# Patient Record
Sex: Female | Born: 1960 | Race: White | Hispanic: No | Marital: Single | Smoking: Never smoker
Health system: Southern US, Community
[De-identification: ages and names within clinical notes are randomized; demographics above are authoritative.]

## PROBLEM LIST (undated history)

## (undated) DIAGNOSIS — K219 Gastro-esophageal reflux disease without esophagitis: Secondary | ICD-10-CM

## (undated) DIAGNOSIS — M1990 Unspecified osteoarthritis, unspecified site: Secondary | ICD-10-CM

## (undated) DIAGNOSIS — F32A Depression, unspecified: Secondary | ICD-10-CM

## (undated) DIAGNOSIS — G4730 Sleep apnea, unspecified: Secondary | ICD-10-CM

## (undated) DIAGNOSIS — F329 Major depressive disorder, single episode, unspecified: Secondary | ICD-10-CM

## (undated) DIAGNOSIS — T7840XA Allergy, unspecified, initial encounter: Secondary | ICD-10-CM

## (undated) DIAGNOSIS — J302 Other seasonal allergic rhinitis: Secondary | ICD-10-CM

## (undated) DIAGNOSIS — K649 Unspecified hemorrhoids: Secondary | ICD-10-CM

## (undated) HISTORY — DX: Depression, unspecified: F32.A

## (undated) HISTORY — DX: Major depressive disorder, single episode, unspecified: F32.9

## (undated) HISTORY — PX: TONSILLECTOMY: SUR1361

## (undated) HISTORY — DX: Unspecified hemorrhoids: K64.9

## (undated) HISTORY — DX: Gastro-esophageal reflux disease without esophagitis: K21.9

## (undated) HISTORY — DX: Allergy, unspecified, initial encounter: T78.40XA

---

## 2004-10-22 ENCOUNTER — Ambulatory Visit: Payer: Self-pay

## 2005-11-19 ENCOUNTER — Ambulatory Visit: Payer: Self-pay

## 2005-12-09 ENCOUNTER — Ambulatory Visit: Payer: Self-pay

## 2006-05-20 ENCOUNTER — Ambulatory Visit: Payer: Self-pay

## 2006-12-12 ENCOUNTER — Ambulatory Visit: Payer: Self-pay

## 2007-02-13 ENCOUNTER — Ambulatory Visit: Payer: Self-pay | Admitting: Family Medicine

## 2007-02-17 ENCOUNTER — Ambulatory Visit: Payer: Self-pay | Admitting: Family Medicine

## 2007-12-10 IMAGING — US ABDOMEN ULTRASOUND
1 series · 17 of 25 positions shown · non-contrast
Comparison: none

REASON FOR EXAM: abdominal pain
COMMENTS:

[Series 1: abdomen ultrasound · 17 of 63 slices shown]
[im 1/63]
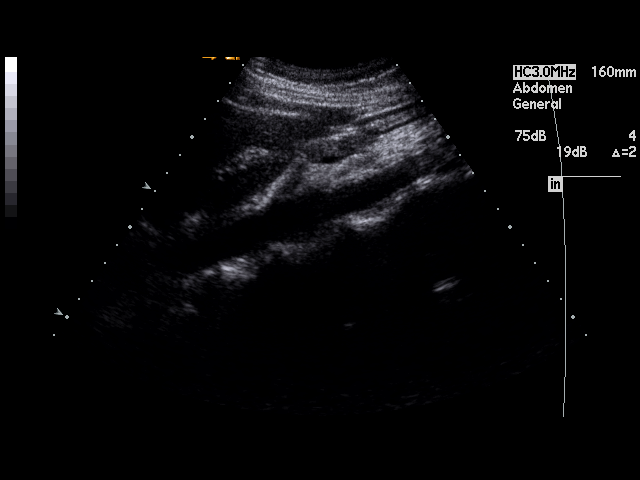
[im 6/63]
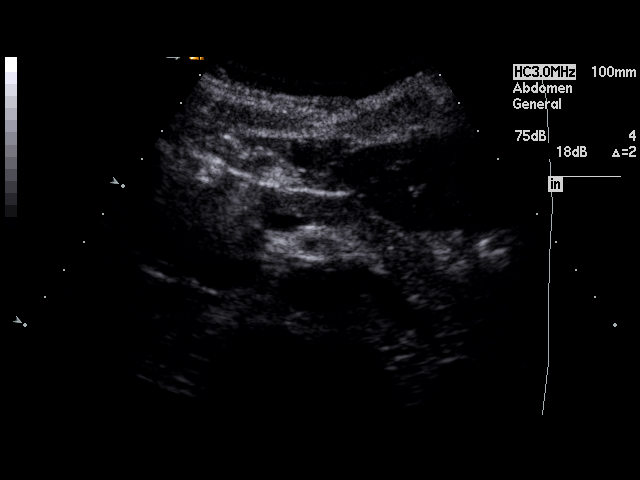
[im 8/63]
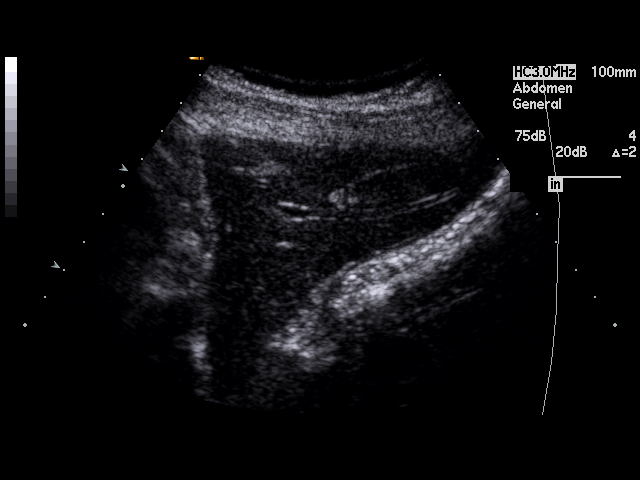
[im 13/63]
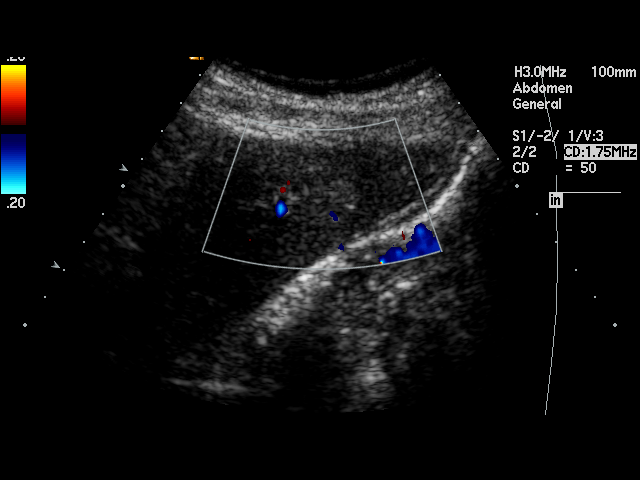
[im 16/63]
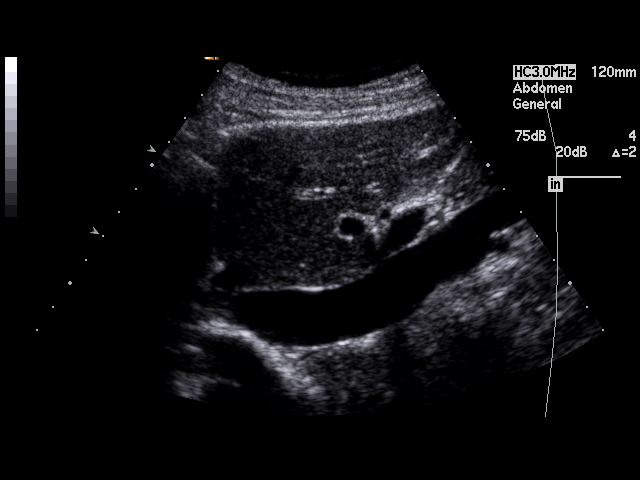
[im 21/63]
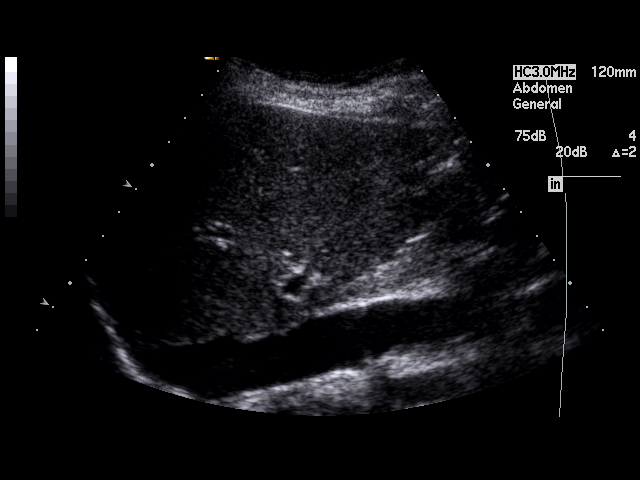
[im 24/63]
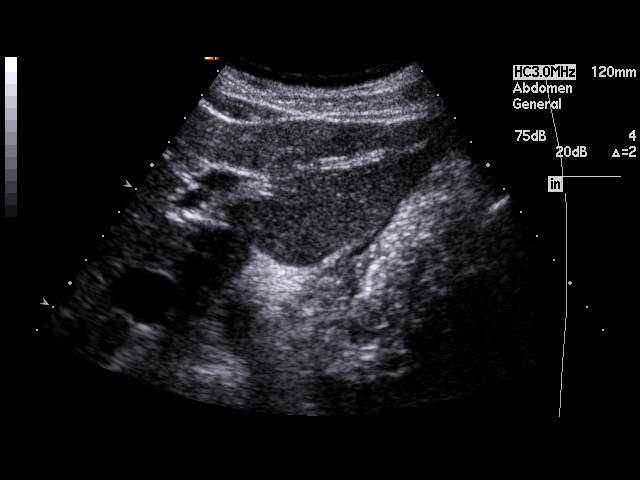
[im 29/63]
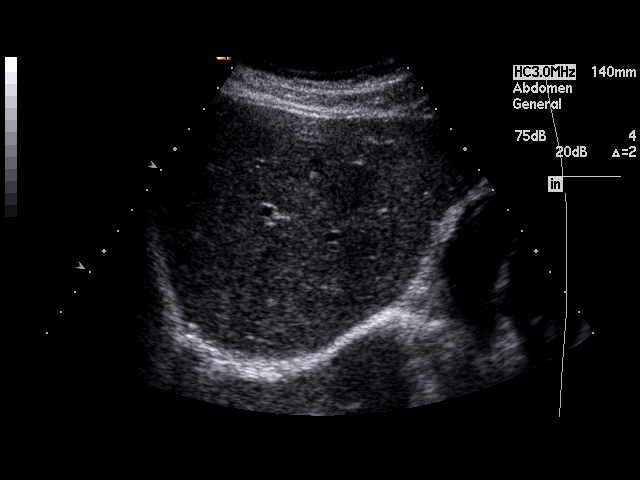
[im 32/63]
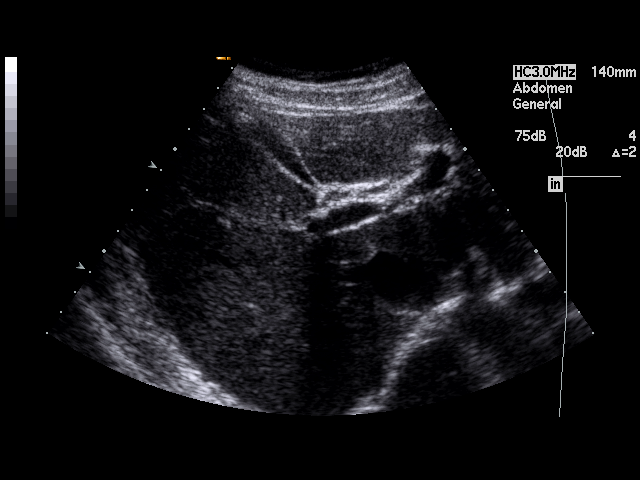
[im 34/63]
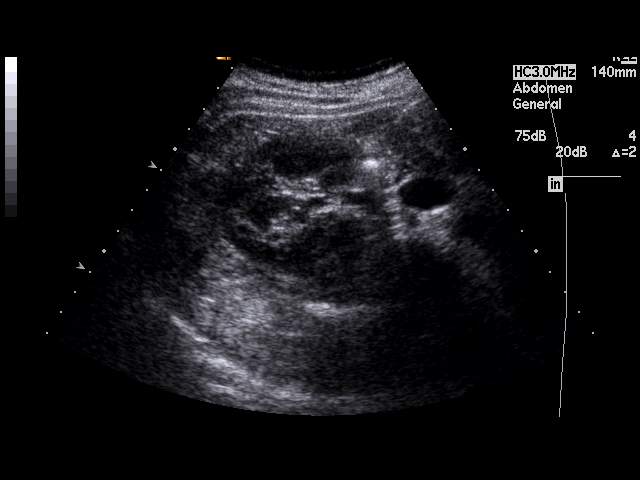
[im 39/63]
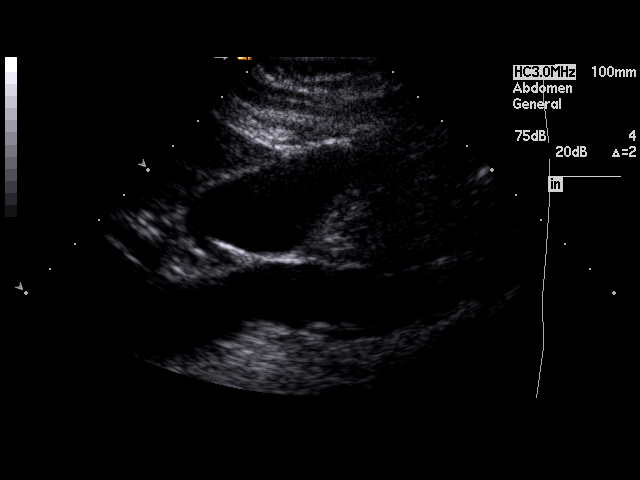
[im 42/63]
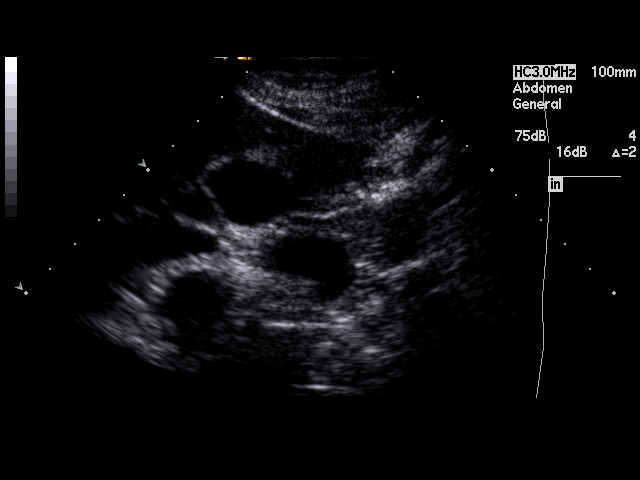
[im 47/63]
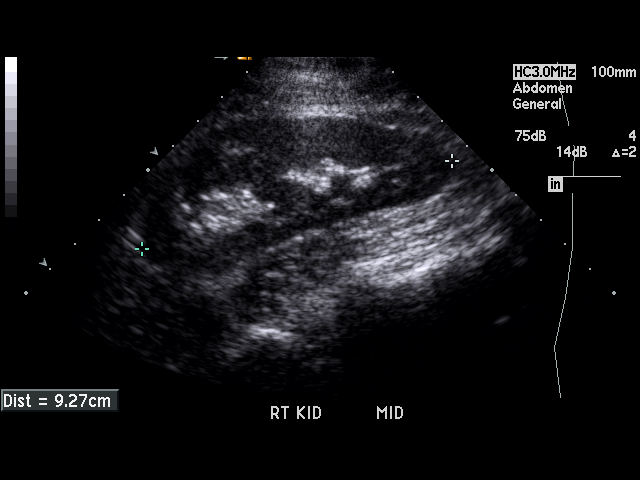
[im 50/63]
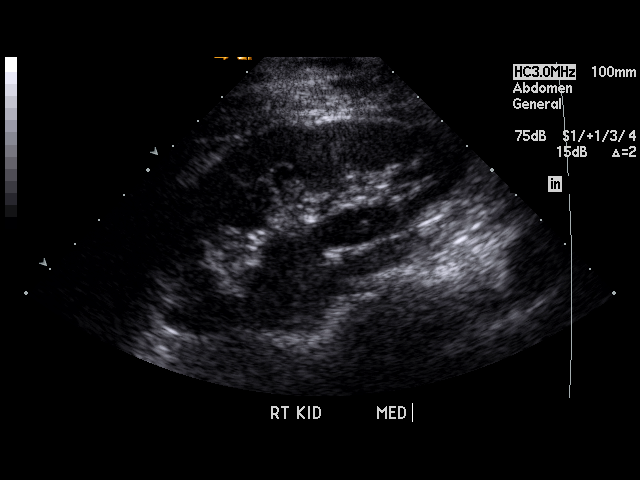
[im 55/63]
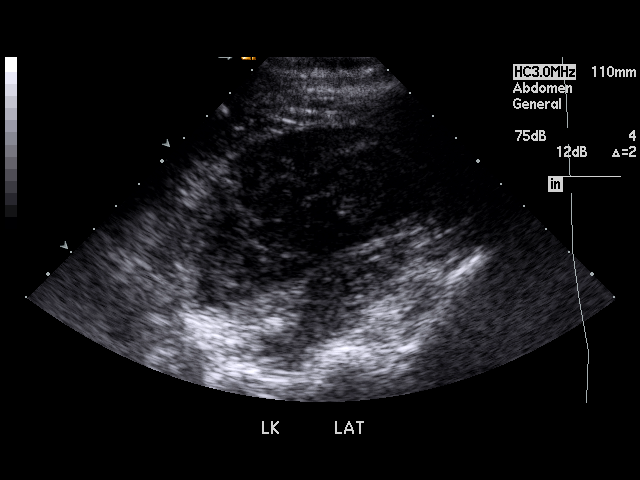
[im 57/63]
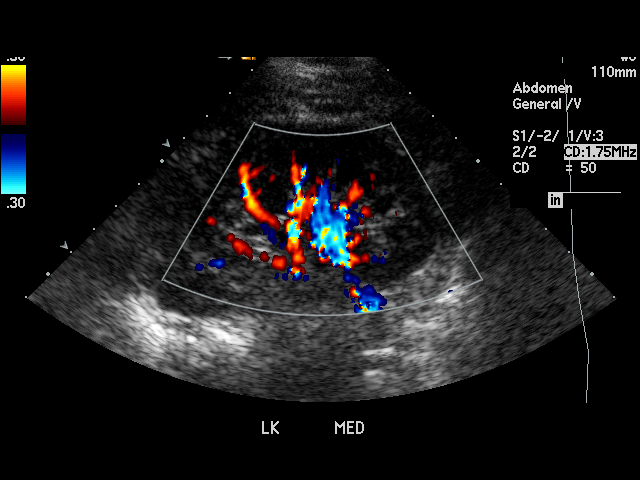
[im 63/63]
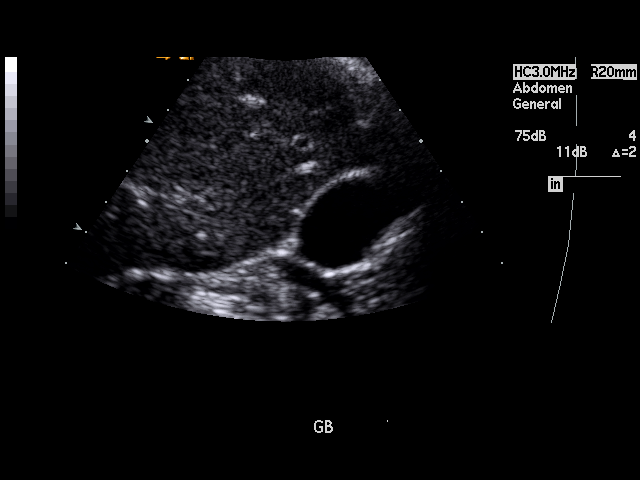

[17 of 25 positions shown; findings below may reference images not displayed]

PROCEDURE:     US  - US ABDOMEN GENERAL SURVEY  - November 19, 2005  [DATE]

RESULT:          There is a 1 cm, slightly hyperechoic focus in the LEFT
lobe of the liver.  This is actually thought to be artifactual and secondary
to adjacent vascular structures.  The possibility, however, of a small
hepatic hemangioma cannot be totally excluded on this exam.  Follow up
examination in six months is recommended to document stability.  The hepatic
echo pattern otherwise is normal in appearance.  The spleen, abdominal
aorta, and pancreas show no significant abnormalities.  No gallstones are
seen. There is no thickening of the gallbladder wall.  The common bile duct
measures 3.2 mm in diameter, which is within normal limits.  The kidneys
show no hydronephrosis.  There is no ascites.
IMPRESSION: 1.     Possible 1 cm hyperechoic focus in the liver as described above.
Follow up examination in six months is recommended to document stability.
2.     No gallstones or other acute change is identified.

## 2007-12-13 ENCOUNTER — Ambulatory Visit: Payer: Self-pay

## 2009-02-03 ENCOUNTER — Ambulatory Visit: Payer: Self-pay

## 2010-02-05 ENCOUNTER — Ambulatory Visit: Payer: Self-pay

## 2010-08-06 ENCOUNTER — Ambulatory Visit: Payer: Self-pay | Admitting: Family Medicine

## 2011-04-13 ENCOUNTER — Ambulatory Visit: Payer: Self-pay

## 2012-04-18 ENCOUNTER — Ambulatory Visit: Payer: Self-pay

## 2013-06-07 HISTORY — PX: COLONOSCOPY: SHX174

## 2013-12-11 ENCOUNTER — Ambulatory Visit: Payer: Self-pay | Admitting: Obstetrics and Gynecology

## 2014-01-04 ENCOUNTER — Ambulatory Visit: Payer: Self-pay | Admitting: Gastroenterology

## 2015-06-25 ENCOUNTER — Encounter: Payer: Self-pay | Admitting: Family Medicine

## 2015-10-23 ENCOUNTER — Ambulatory Visit (INDEPENDENT_AMBULATORY_CARE_PROVIDER_SITE_OTHER): Payer: BLUE CROSS/BLUE SHIELD | Admitting: Family Medicine

## 2015-10-23 ENCOUNTER — Encounter: Payer: Self-pay | Admitting: Family Medicine

## 2015-10-23 VITALS — BP 100/70 | HR 80 | Ht 65.0 in | Wt 146.0 lb

## 2015-10-23 DIAGNOSIS — K219 Gastro-esophageal reflux disease without esophagitis: Secondary | ICD-10-CM

## 2015-10-23 DIAGNOSIS — F329 Major depressive disorder, single episode, unspecified: Secondary | ICD-10-CM

## 2015-10-23 DIAGNOSIS — B354 Tinea corporis: Secondary | ICD-10-CM | POA: Diagnosis not present

## 2015-10-23 DIAGNOSIS — L259 Unspecified contact dermatitis, unspecified cause: Secondary | ICD-10-CM | POA: Diagnosis not present

## 2015-10-23 DIAGNOSIS — F32A Depression, unspecified: Secondary | ICD-10-CM

## 2015-10-23 DIAGNOSIS — Z13220 Encounter for screening for lipoid disorders: Secondary | ICD-10-CM

## 2015-10-23 MED ORDER — CITALOPRAM HYDROBROMIDE 40 MG PO TABS: 40.0000 mg | ORAL_TABLET | Freq: Every day | ORAL | Status: DC

## 2015-10-23 MED ORDER — NYSTATIN-TRIAMCINOLONE 100000-0.1 UNIT/GM-% EX CREA: 1.0000 "application " | TOPICAL_CREAM | Freq: Two times a day (BID) | CUTANEOUS | Status: DC

## 2015-10-23 MED ORDER — CETIRIZINE-PSEUDOEPHEDRINE ER 5-120 MG PO TB12: 1.0000 | ORAL_TABLET | Freq: Two times a day (BID) | ORAL | Status: DC

## 2015-10-23 MED ORDER — PANTOPRAZOLE SODIUM 40 MG PO TBEC: 40.0000 mg | DELAYED_RELEASE_TABLET | Freq: Every day | ORAL | Status: DC

## 2015-10-23 NOTE — Addendum Note (Signed)
Addended by: Everitt AmberLYNCH, TARA L on: 10/23/2015 03:34 PM   Modules accepted: Orders

## 2015-10-23 NOTE — Progress Notes (Signed)
Name: Debbie Bell   MRN: [id number]    DOB: 12/11/1960   Date:10/23/2015       Progress Note  Subjective  Chief Complaint  Chief Complaint  Patient presents with  . Depression  . Gastroesophageal Reflux  . Allergic Rhinitis   . Rash    itching on body in areas of "warmth"    Depression        This is a chronic problem.  The current episode started more than 1 year ago.   The onset quality is gradual.   The problem occurs intermittently.  The problem has been gradually improving since onset.  Associated symptoms include no decreased concentration, no fatigue, no helplessness, no hopelessness, does not have insomnia, not irritable, no restlessness, no decreased interest, no appetite change, no body aches, no myalgias, no headaches, no indigestion, not sad and no suicidal ideas.     The symptoms are aggravated by nothing.  Past treatments include SSRIs - Selective serotonin reuptake inhibitors.  Compliance with treatment is good.  Previous treatment provided no relief relief. Gastroesophageal Reflux She complains of heartburn. She reports no abdominal pain, no belching, no chest pain, no choking, no coughing, no dysphagia, no nausea, no sore throat or no wheezing. This is a chronic problem. The current episode started more than 1 year ago. The symptoms are aggravated by certain foods. Pertinent negatives include no fatigue, melena or weight loss. She has tried a PPI for the symptoms. The treatment provided significant (no relief on nexium) relief.  Rash This is a chronic problem. The current episode started more than 1 year ago. The problem has been waxing and waning since onset. The rash is characterized by itchiness. She was exposed to plant contact. Pertinent negatives include no congestion, cough, diarrhea, fatigue, fever, joint pain, shortness of breath or sore throat. The treatment provided moderate relief.    No problem-specific assessment & plan notes found for this encounter.   Past  Medical History  Diagnosis Date  . Allergy   . Hemorrhoid   . GERD (gastroesophageal reflux disease)   . Depression     Past Surgical History  Procedure Laterality Date  . Tonsillectomy    . Colonoscopy  2015    polyps/ repeat in 2 yrs- Dr Servando Snare    Family History  Problem Relation Age of Onset  . Heart disease Father   . Cancer Paternal Aunt   . Stroke Maternal Grandmother     Social History   Social History  . Marital Status: Single    Spouse Name: N/A  . Number of Children: N/A  . Years of Education: N/A   Occupational History  . Not on file.   Social History Main Topics  . Smoking status: Never Smoker   . Smokeless tobacco: Not on file  . Alcohol Use: 0.0 oz/week    0 Standard drinks or equivalent per week  . Drug Use: No  . Sexual Activity: Yes   Other Topics Concern  . Not on file   Social History Narrative  . No narrative on file    Allergies  Allergen Reactions  . Penicillins      Review of Systems  Constitutional: Negative for fever, chills, weight loss, malaise/fatigue, appetite change and fatigue.  HENT: Negative for congestion, ear discharge, ear pain and sore throat.   Eyes: Negative for blurred vision.  Respiratory: Negative for cough, sputum production, choking, shortness of breath and wheezing.   Cardiovascular: Negative for chest pain, palpitations and  leg swelling.  Gastrointestinal: Positive for heartburn. Negative for dysphagia, nausea, abdominal pain, diarrhea, constipation, blood in stool and melena.  Genitourinary: Negative for dysuria, urgency, frequency and hematuria.  Musculoskeletal: Negative for myalgias, back pain, joint pain and neck pain.  Skin: Positive for rash.  Neurological: Negative for dizziness, tingling, sensory change, focal weakness and headaches.  Endo/Heme/Allergies: Negative for environmental allergies and polydipsia. Does not bruise/bleed easily.  Psychiatric/Behavioral: Positive for depression. Negative for  suicidal ideas and decreased concentration. The patient is not nervous/anxious and does not have insomnia.      Objective  Filed Vitals:   10/23/15 0837  BP: 100/70  Pulse: 80  Height: 5\' 5"  (1.651 m)  Weight: 146 lb (66.225 kg)    Physical Exam  Constitutional: She is well-developed, well-nourished, and in no distress. She is not irritable. No distress.  HENT:  Head: Normocephalic and atraumatic.  Right Ear: External ear normal.  Left Ear: External ear normal.  Nose: Nose normal.  Mouth/Throat: Oropharynx is clear and moist.  Eyes: Conjunctivae and EOM are normal. Pupils are equal, round, and reactive to light. Right eye exhibits no discharge. Left eye exhibits no discharge.  Neck: Normal range of motion. Neck supple. No JVD present. No thyromegaly present.  Cardiovascular: Normal rate, regular rhythm, normal heart sounds and intact distal pulses.  Exam reveals no gallop and no friction rub.   No murmur heard. Pulmonary/Chest: Effort normal and breath sounds normal.  Abdominal: Soft. Bowel sounds are normal. She exhibits no mass. There is no tenderness. There is no guarding.  Musculoskeletal: Normal range of motion. She exhibits no edema.  Lymphadenopathy:    She has no cervical adenopathy.  Neurological: She is alert. She has normal reflexes.  Skin: Skin is warm and dry. She is not diaphoretic.  Psychiatric: Mood and affect normal.  Nursing note and vitals reviewed.     Assessment & Plan  Problem List Items Addressed This Visit    None    Visit Diagnoses    Depression    -  Primary    Relevant Medications    citalopram (CELEXA) 40 MG tablet    Gastroesophageal reflux disease, esophagitis presence not specified        Relevant Medications    meclizine (ANTIVERT) 25 MG tablet    pantoprazole (PROTONIX) 40 MG tablet    Contact dermatitis        Relevant Medications    nystatin-triamcinolone (MYCOLOG II) cream    Tinea corporis        Relevant Medications     nystatin-triamcinolone (MYCOLOG II) cream    Lipid screening        Relevant Orders    Renal Function Panel    Lipid Profile         [facility]  10/23/2015

## 2015-10-24 LAB — RENAL FUNCTION PANEL
Albumin: 4.7 g/dL (ref 3.5–5.5)
BUN/Creatinine Ratio: 18 (ref 9–23)
BUN: 14 mg/dL (ref 6–24)
CALCIUM: 9.8 mg/dL (ref 8.7–10.2)
CO2: 25 mmol/L (ref 18–29)
CREATININE: 0.78 mg/dL (ref 0.57–1.00)
Chloride: 97 mmol/L (ref 96–106)
GFR calc Af Amer: 100 mL/min/{1.73_m2} (ref >59)
GFR calc non Af Amer: 87 mL/min/{1.73_m2} (ref >59)
GLUCOSE: 75 mg/dL (ref 65–99)
PHOSPHORUS: 3.3 mg/dL (ref 2.5–4.5)
POTASSIUM: 4.5 mmol/L (ref 3.5–5.2)
SODIUM: 140 mmol/L (ref 134–144)

## 2015-10-24 LAB — LIPID PANEL
CHOLESTEROL TOTAL: 213 mg/dL — AB (ref 100–199)
Chol/HDL Ratio: 3 ratio units (ref 0.0–4.4)
HDL: 71 mg/dL (ref >39)
LDL Calculated: 124 mg/dL — ABNORMAL HIGH (ref 0–99)
TRIGLYCERIDES: 91 mg/dL (ref 0–149)
VLDL Cholesterol Cal: 18 mg/dL (ref 5–40)

## 2015-12-10 DIAGNOSIS — R5383 Other fatigue: Secondary | ICD-10-CM | POA: Diagnosis not present

## 2015-12-10 DIAGNOSIS — D692 Other nonthrombocytopenic purpura: Secondary | ICD-10-CM | POA: Diagnosis not present

## 2015-12-10 DIAGNOSIS — S30860A Insect bite (nonvenomous) of lower back and pelvis, initial encounter: Secondary | ICD-10-CM | POA: Diagnosis not present

## 2015-12-10 DIAGNOSIS — W57XXXA Bitten or stung by nonvenomous insect and other nonvenomous arthropods, initial encounter: Secondary | ICD-10-CM | POA: Diagnosis not present

## 2015-12-11 ENCOUNTER — Ambulatory Visit: Payer: BLUE CROSS/BLUE SHIELD | Admitting: Family Medicine

## 2016-01-22 ENCOUNTER — Other Ambulatory Visit: Payer: Self-pay

## 2016-01-23 ENCOUNTER — Encounter: Payer: Self-pay | Admitting: Family Medicine

## 2016-01-23 ENCOUNTER — Ambulatory Visit (INDEPENDENT_AMBULATORY_CARE_PROVIDER_SITE_OTHER): Payer: BLUE CROSS/BLUE SHIELD | Admitting: Family Medicine

## 2016-01-23 VITALS — BP 110/80 | HR 64 | Ht 66.0 in | Wt 144.0 lb

## 2016-01-23 DIAGNOSIS — L259 Unspecified contact dermatitis, unspecified cause: Secondary | ICD-10-CM | POA: Diagnosis not present

## 2016-01-23 DIAGNOSIS — T700XXA Otitic barotrauma, initial encounter: Secondary | ICD-10-CM

## 2016-01-23 DIAGNOSIS — L509 Urticaria, unspecified: Secondary | ICD-10-CM | POA: Diagnosis not present

## 2016-01-23 MED ORDER — PREDNISONE 10 MG PO TABS: ORAL_TABLET | ORAL | 1 refills | Status: DC

## 2016-01-23 MED ORDER — DESOXIMETASONE 0.25 % EX CREA: 1.0000 "application " | TOPICAL_CREAM | Freq: Two times a day (BID) | CUTANEOUS | 0 refills | Status: DC

## 2016-01-23 NOTE — Progress Notes (Signed)
Name: Debbie Bell   MRN: [id number]    DOB: 05/19/1961   Date:01/23/2016       Progress Note  Subjective  Chief Complaint  Chief Complaint  Patient presents with  . Rash    not getting any better-     Rash  This is a chronic problem. The current episode started more than 1 month ago. The problem has been gradually worsening since onset. The rash is diffuse. The rash is characterized by dryness, scaling, redness and itchiness. She was exposed to chemicals. Associated symptoms include fatigue and a fever. Pertinent negatives include no anorexia, congestion, cough, diarrhea, eye pain, facial edema, joint pain, nail changes, rhinorrhea, shortness of breath, sore throat or vomiting. (Hesdache) Past treatments include topical steroids. The treatment provided mild relief.    No problem-specific Assessment & Plan notes found for this encounter.   Past Medical History:  Diagnosis Date  . Allergy   . Depression   . GERD (gastroesophageal reflux disease)   . Hemorrhoid     Past Surgical History:  Procedure Laterality Date  . COLONOSCOPY  2015   polyps/ repeat in 2 yrs- Dr Servando SnareWohl  . TONSILLECTOMY      Family History  Problem Relation Age of Onset  . Heart disease Father   . Cancer Paternal Aunt   . Stroke Maternal Grandmother     Social History   Social History  . Marital status: Single    Spouse name: N/A  . Number of children: N/A  . Years of education: N/A   Occupational History  . Not on file.   Social History Main Topics  . Smoking status: Never Smoker  . Smokeless tobacco: Not on file  . Alcohol use 0.0 oz/week  . Drug use: No  . Sexual activity: Yes   Other Topics Concern  . Not on file   Social History Narrative  . No narrative on file    Allergies  Allergen Reactions  . Penicillins      Review of Systems  Constitutional: Positive for fatigue and fever. Negative for chills, malaise/fatigue and weight loss.  HENT: Negative for congestion, ear  discharge, ear pain, rhinorrhea and sore throat.   Eyes: Negative for blurred vision and pain.  Respiratory: Negative for cough, sputum production, shortness of breath and wheezing.   Cardiovascular: Negative for chest pain, palpitations and leg swelling.  Gastrointestinal: Negative for abdominal pain, anorexia, blood in stool, constipation, diarrhea, heartburn, melena, nausea and vomiting.  Genitourinary: Negative for dysuria, frequency, hematuria and urgency.  Musculoskeletal: Negative for back pain, joint pain, myalgias and neck pain.  Skin: Positive for rash. Negative for nail changes.  Neurological: Negative for dizziness, tingling, sensory change, focal weakness and headaches.  Endo/Heme/Allergies: Negative for environmental allergies and polydipsia. Does not bruise/bleed easily.  Psychiatric/Behavioral: Negative for depression and suicidal ideas. The patient is not nervous/anxious and does not have insomnia.      Objective  Vitals:   01/23/16 1433  BP: 110/80  Pulse: 64  Weight: 144 lb (65.3 kg)  Height: 5\' 6"  (1.676 m)    Physical Exam  Constitutional: She is well-developed, well-nourished, and in no distress. No distress.  HENT:  Head: Normocephalic and atraumatic.  Right Ear: External ear and ear canal normal. Tympanic membrane is perforated.  Left Ear: Tympanic membrane, external ear and ear canal normal.  Nose: Nose normal.  Mouth/Throat: Oropharynx is clear and moist.  Bleeding noted  Eyes: Conjunctivae and EOM are normal. Pupils are equal, round,  and reactive to light. Right eye exhibits no discharge. Left eye exhibits no discharge.  Neck: Normal range of motion. Neck supple. No JVD present. No thyromegaly present.  Cardiovascular: Normal rate, regular rhythm, normal heart sounds and intact distal pulses.  Exam reveals no gallop and no friction rub.   No murmur heard. Pulmonary/Chest: Effort normal and breath sounds normal. She has no wheezes. She has no rales.    Abdominal: Soft. Bowel sounds are normal. She exhibits no mass. There is no tenderness. There is no guarding.  Musculoskeletal: Normal range of motion. She exhibits no edema.  Lymphadenopathy:    She has no cervical adenopathy.  Neurological: She is alert. She has normal reflexes.  Skin: Skin is warm and dry. Rash noted. Rash is maculopapular and urticarial. She is not diaphoretic. There is erythema.     Psychiatric: Mood and affect normal.  Nursing note and vitals reviewed.     Assessment & Plan  Problem List Items Addressed This Visit    None    Visit Diagnoses    Contact dermatitis    -  Primary   Relevant Medications   predniSONE (DELTASONE) 10 MG tablet   desoximetasone (TOPICORT) 0.25 % cream   Barotrauma, otic, initial encounter       Urticaria       claritin/ benedryl        Dr. Hayden Rasmusseneanna Jones [facility]  01/23/16

## 2016-02-20 ENCOUNTER — Ambulatory Visit (INDEPENDENT_AMBULATORY_CARE_PROVIDER_SITE_OTHER): Payer: BLUE CROSS/BLUE SHIELD | Admitting: Family Medicine

## 2016-02-20 ENCOUNTER — Encounter: Payer: Self-pay | Admitting: Family Medicine

## 2016-02-20 VITALS — BP 120/80 | HR 64 | Ht 66.0 in | Wt 145.0 lb

## 2016-02-20 DIAGNOSIS — H6983 Other specified disorders of Eustachian tube, bilateral: Secondary | ICD-10-CM | POA: Diagnosis not present

## 2016-02-20 DIAGNOSIS — H722X1 Other marginal perforations of tympanic membrane, right ear: Secondary | ICD-10-CM | POA: Diagnosis not present

## 2016-02-20 NOTE — Progress Notes (Signed)
Name: Debbie Bell   MRN: [id number]    DOB: 08/12/1960   Date:02/20/2016       Progress Note  Subjective  Chief Complaint  Chief Complaint  Patient presents with  . Follow-up    itching- subsided  . ruptured eardrum    wanted to follow up on the R) ear     Otalgia   There is pain in the right (perorated tm) ear. Chronicity: resolved. The current episode started 1 to 4 weeks ago. The problem occurs every few minutes. The problem has been gradually improving. There has been no fever. The patient is experiencing no pain. Pertinent negatives include no abdominal pain, coughing, diarrhea, ear discharge, headaches, hearing loss, neck pain, rash or sore throat. Treatments tried: antihistamine/pred. The treatment provided significant relief. There is no history of a chronic ear infection.    No problem-specific Assessment & Plan notes found for this encounter.   Past Medical History:  Diagnosis Date  . Allergy   . Depression   . GERD (gastroesophageal reflux disease)   . Hemorrhoid     Past Surgical History:  Procedure Laterality Date  . COLONOSCOPY  2015   polyps/ repeat in 2 yrs- Dr Servando SnareWohl  . TONSILLECTOMY      Family History  Problem Relation Age of Onset  . Heart disease Father   . Cancer Paternal Aunt   . Stroke Maternal Grandmother     Social History   Social History  . Marital status: Single    Spouse name: N/A  . Number of children: N/A  . Years of education: N/A   Occupational History  . Not on file.   Social History Main Topics  . Smoking status: Never Smoker  . Smokeless tobacco: Not on file  . Alcohol use 0.0 oz/week  . Drug use: No  . Sexual activity: Yes   Other Topics Concern  . Not on file   Social History Narrative  . No narrative on file    Allergies  Allergen Reactions  . Penicillins      Review of Systems  Constitutional: Negative for chills, fever, malaise/fatigue and weight loss.  HENT: Positive for ear pain. Negative for ear  discharge, hearing loss and sore throat.   Eyes: Negative for blurred vision.  Respiratory: Negative for cough, sputum production, shortness of breath and wheezing.   Cardiovascular: Negative for chest pain, palpitations and leg swelling.  Gastrointestinal: Negative for abdominal pain, blood in stool, constipation, diarrhea, heartburn, melena and nausea.  Genitourinary: Negative for dysuria, frequency, hematuria and urgency.  Musculoskeletal: Negative for back pain, joint pain, myalgias and neck pain.  Skin: Negative for rash.  Neurological: Negative for dizziness, tingling, sensory change, focal weakness and headaches.  Endo/Heme/Allergies: Negative for environmental allergies and polydipsia. Does not bruise/bleed easily.  Psychiatric/Behavioral: Negative for depression and suicidal ideas. The patient is not nervous/anxious and does not have insomnia.      Objective  Vitals:   02/20/16 0818  BP: 120/80  Pulse: 64  Weight: 145 lb (65.8 kg)  Height: 5\' 6"  (1.676 m)    Physical Exam  Constitutional: She is well-developed, well-nourished, and in no distress. No distress.  HENT:  Head: Normocephalic and atraumatic.  Right Ear: External ear and ear canal normal. A middle ear effusion is present. No decreased hearing is noted.  Left Ear: External ear and ear canal normal. A middle ear effusion is present. No decreased hearing is noted.  Nose: Nose normal.  Mouth/Throat: Oropharynx is clear  and moist.  Eyes: Conjunctivae and EOM are normal. Pupils are equal, round, and reactive to light. Right eye exhibits no discharge. Left eye exhibits no discharge.  Neck: Normal range of motion. Neck supple. No JVD present. No thyromegaly present.  Cardiovascular: Normal rate, regular rhythm, normal heart sounds and intact distal pulses.  Exam reveals no gallop and no friction rub.   No murmur heard. Pulmonary/Chest: Effort normal and breath sounds normal.  Abdominal: Soft. Bowel sounds are normal.   Musculoskeletal: Normal range of motion. She exhibits no edema.  Lymphadenopathy:    She has no cervical adenopathy.  Neurological: She is alert.  Skin: Skin is warm and dry. She is not diaphoretic.  Psychiatric: Mood and affect normal.  Nursing note and vitals reviewed.     Assessment & Plan  Problem List Items Addressed This Visit    None    Visit Diagnoses    Tympanic membrane perforation, marginal, right    -  Primary   resolved   Eustachian tube dysfunction, bilateral       cont zyrtec D        Dr. [facility]  02/20/16

## 2016-03-10 ENCOUNTER — Other Ambulatory Visit: Payer: Self-pay | Admitting: Family Medicine

## 2016-03-10 DIAGNOSIS — L259 Unspecified contact dermatitis, unspecified cause: Secondary | ICD-10-CM

## 2016-04-28 ENCOUNTER — Other Ambulatory Visit: Payer: Self-pay | Admitting: Family Medicine

## 2016-07-08 ENCOUNTER — Other Ambulatory Visit: Payer: Self-pay | Admitting: Family Medicine

## 2016-07-18 ENCOUNTER — Other Ambulatory Visit: Payer: Self-pay | Admitting: Family Medicine

## 2016-07-18 DIAGNOSIS — L259 Unspecified contact dermatitis, unspecified cause: Secondary | ICD-10-CM

## 2016-11-25 ENCOUNTER — Other Ambulatory Visit: Payer: Self-pay | Admitting: Family Medicine

## 2016-11-25 DIAGNOSIS — K219 Gastro-esophageal reflux disease without esophagitis: Secondary | ICD-10-CM

## 2016-11-25 DIAGNOSIS — F329 Major depressive disorder, single episode, unspecified: Secondary | ICD-10-CM

## 2016-11-25 DIAGNOSIS — F32A Depression, unspecified: Secondary | ICD-10-CM

## 2016-11-30 ENCOUNTER — Other Ambulatory Visit: Payer: Self-pay | Admitting: Family Medicine

## 2016-11-30 DIAGNOSIS — K219 Gastro-esophageal reflux disease without esophagitis: Secondary | ICD-10-CM

## 2016-11-30 MED ORDER — PANTOPRAZOLE SODIUM 40 MG PO TBEC: DELAYED_RELEASE_TABLET | ORAL | 0 refills | Status: DC

## 2016-11-30 MED ORDER — OMEPRAZOLE 20 MG PO CPDR: 20.0000 mg | DELAYED_RELEASE_CAPSULE | Freq: Every day | ORAL | 3 refills | Status: DC

## 2016-11-30 NOTE — Telephone Encounter (Signed)
Rxpantopazole not covered by insurance changing to Rxomeprazole 20mg  #30

## 2016-11-30 NOTE — Telephone Encounter (Signed)
[facility]

## 2016-12-29 ENCOUNTER — Other Ambulatory Visit: Payer: Self-pay | Admitting: Family Medicine

## 2016-12-29 DIAGNOSIS — F329 Major depressive disorder, single episode, unspecified: Secondary | ICD-10-CM

## 2016-12-29 DIAGNOSIS — F32A Depression, unspecified: Secondary | ICD-10-CM

## 2017-01-24 ENCOUNTER — Other Ambulatory Visit: Payer: Self-pay | Admitting: Family Medicine

## 2017-01-24 DIAGNOSIS — K219 Gastro-esophageal reflux disease without esophagitis: Secondary | ICD-10-CM

## 2017-01-24 DIAGNOSIS — F329 Major depressive disorder, single episode, unspecified: Secondary | ICD-10-CM

## 2017-01-24 DIAGNOSIS — F32A Depression, unspecified: Secondary | ICD-10-CM

## 2017-04-22 ENCOUNTER — Other Ambulatory Visit: Payer: Self-pay

## 2017-04-22 DIAGNOSIS — K219 Gastro-esophageal reflux disease without esophagitis: Secondary | ICD-10-CM

## 2017-04-22 DIAGNOSIS — F329 Major depressive disorder, single episode, unspecified: Secondary | ICD-10-CM

## 2017-04-22 DIAGNOSIS — F32A Depression, unspecified: Secondary | ICD-10-CM

## 2017-04-22 MED ORDER — CITALOPRAM HYDROBROMIDE 40 MG PO TABS: 40.0000 mg | ORAL_TABLET | Freq: Every day | ORAL | 0 refills | Status: DC

## 2017-04-22 MED ORDER — PANTOPRAZOLE SODIUM 40 MG PO TBEC: DELAYED_RELEASE_TABLET | ORAL | 0 refills | Status: DC

## 2017-04-25 ENCOUNTER — Encounter: Payer: Self-pay | Admitting: Family Medicine

## 2017-04-25 ENCOUNTER — Ambulatory Visit (INDEPENDENT_AMBULATORY_CARE_PROVIDER_SITE_OTHER): Payer: BLUE CROSS/BLUE SHIELD | Admitting: Family Medicine

## 2017-04-25 DIAGNOSIS — L259 Unspecified contact dermatitis, unspecified cause: Secondary | ICD-10-CM | POA: Diagnosis not present

## 2017-04-25 DIAGNOSIS — K219 Gastro-esophageal reflux disease without esophagitis: Secondary | ICD-10-CM

## 2017-04-25 DIAGNOSIS — F329 Major depressive disorder, single episode, unspecified: Secondary | ICD-10-CM | POA: Diagnosis not present

## 2017-04-25 DIAGNOSIS — B354 Tinea corporis: Secondary | ICD-10-CM

## 2017-04-25 DIAGNOSIS — F32A Depression, unspecified: Secondary | ICD-10-CM

## 2017-04-25 MED ORDER — CITALOPRAM HYDROBROMIDE 40 MG PO TABS: 40.0000 mg | ORAL_TABLET | Freq: Every day | ORAL | 0 refills | Status: DC

## 2017-04-25 MED ORDER — PANTOPRAZOLE SODIUM 40 MG PO TBEC: DELAYED_RELEASE_TABLET | ORAL | 0 refills | Status: DC

## 2017-04-25 MED ORDER — CETIRIZINE-PSEUDOEPHEDRINE ER 5-120 MG PO TB12: 1.0000 | ORAL_TABLET | Freq: Two times a day (BID) | ORAL | 3 refills | Status: DC

## 2017-04-25 MED ORDER — DESOXIMETASONE 0.25 % EX CREA: TOPICAL_CREAM | CUTANEOUS | 5 refills | Status: DC

## 2017-04-25 MED ORDER — MONTELUKAST SODIUM 10 MG PO TABS: 10.0000 mg | ORAL_TABLET | Freq: Every day | ORAL | 1 refills | Status: DC

## 2017-04-25 NOTE — Progress Notes (Signed)
Name: Debbie Bell   MRN: [id number]    DOB: 05/13/1961   Date:04/25/2017       Progress Note  Subjective  Chief Complaint  Chief Complaint  Patient presents with  . Allergic Rhinitis   . Depression  . Gastroesophageal Reflux    Depression       The patient presents with depression.  This is a chronic problem.  The current episode started more than 1 year ago.   The onset quality is gradual.   The problem occurs intermittently.  The problem has been gradually worsening since onset.  Associated symptoms include irritable.  Associated symptoms include no decreased concentration, no fatigue, no helplessness, no hopelessness, does not have insomnia, no restlessness, no decreased interest, no appetite change, no body aches, no myalgias, no headaches, no indigestion, not sad and no suicidal ideas.     The symptoms are aggravated by nothing.  Past treatments include SSRIs - Selective serotonin reuptake inhibitors.  Compliance with treatment is good (trial off/need resume).  Previous treatment provided moderate relief.  Past medical history includes depression.     Pertinent negatives include no chronic fatigue syndrome and no chronic pain. Gastroesophageal Reflux  She complains of coughing. She reports no abdominal pain, no belching, no chest pain, no choking, no dysphagia, no early satiety, no globus sensation, no heartburn, no hoarse voice, no nausea, no sore throat, no stridor, no tooth decay, no water brash or no wheezing. This is a new problem. The current episode started more than 1 year ago. The problem occurs frequently. The problem has been waxing and waning. The symptoms are aggravated by certain foods. Pertinent negatives include no anemia, fatigue, melena, muscle weakness, orthopnea or weight loss. She has tried a PPI for the symptoms. The treatment provided moderate relief.  URI   This is a chronic problem. The current episode started more than 1 year ago. The problem has been waxing and  waning. There has been no fever. Associated symptoms include coughing. Pertinent negatives include no abdominal pain, chest pain, diarrhea, dysuria, ear pain, headaches, joint pain, nausea, neck pain, rash, sinus pain, sore throat or wheezing. She has tried antihistamine and decongestant for the symptoms. The treatment provided mild relief.    No problem-specific Assessment & Plan notes found for this encounter.   Past Medical History:  Diagnosis Date  . Allergy   . Depression   . GERD (gastroesophageal reflux disease)   . Hemorrhoid     Past Surgical History:  Procedure Laterality Date  . COLONOSCOPY  2015   polyps/ repeat in 2 yrs- Dr Servando SnareWohl  . TONSILLECTOMY      Family History  Problem Relation Age of Onset  . Heart disease Father   . Cancer Paternal Aunt   . Stroke Maternal Grandmother     Social History   Socioeconomic History  . Marital status: Single    Spouse name: Not on file  . Number of children: Not on file  . Years of education: Not on file  . Highest education level: Not on file  Social Needs  . Financial resource strain: Not on file  . Food insecurity - worry: Not on file  . Food insecurity - inability: Not on file  . Transportation needs - medical: Not on file  . Transportation needs - non-medical: Not on file  Occupational History  . Not on file  Tobacco Use  . Smoking status: Never Smoker  Substance and Sexual Activity  . Alcohol use: Yes  Alcohol/week: 0.0 oz  . Drug use: No  . Sexual activity: Yes  Other Topics Concern  . Not on file  Social History Narrative  . Not on file    Allergies  Allergen Reactions  . Penicillins     Outpatient Medications Prior to Visit  Medication Sig Dispense Refill  . hydrocortisone (ANUSOL-HC) 2.5 % rectal cream Place 1 application rectally 2 (two) times daily. PRN    . meclizine (ANTIVERT) 25 MG tablet Take 25 mg by mouth 3 (three) times daily as needed for dizziness.    . nystatin-triamcinolone  (MYCOLOG II) cream Apply 1 application topically 2 (two) times daily. PRN 30 g 11  . cetirizine-pseudoephedrine (ZYRTEC-D) 5-120 MG tablet TAKE 1 TABLET BY MOUTH TWICE DAILY 60 tablet 1  . citalopram (CELEXA) 40 MG tablet Take 1 tablet (40 mg total) daily by mouth. 30 tablet 0  . desoximetasone (TOPICORT) 0.25 % cream APPLY EXTERNALLY TO THE AFFECTED AREA TWICE DAILY 30 g 0  . pantoprazole (PROTONIX) 40 MG tablet TAKE 1 TABLET BY MOUTH DAILY. REFILL MEDS APPOINTMENT NEEDED. 30 tablet 0  . omeprazole (PRILOSEC) 20 MG capsule Take 1 capsule (20 mg total) by mouth daily. 30 capsule 3   No facility-administered medications prior to visit.     Review of Systems  Constitutional: Negative for appetite change, chills, fatigue, fever, malaise/fatigue and weight loss.  HENT: Negative for ear discharge, ear pain, hoarse voice, sinus pain and sore throat.   Eyes: Negative for blurred vision.  Respiratory: Positive for cough. Negative for sputum production, choking, shortness of breath and wheezing.   Cardiovascular: Negative for chest pain, palpitations and leg swelling.  Gastrointestinal: Negative for abdominal pain, blood in stool, constipation, diarrhea, dysphagia, heartburn, melena and nausea.  Genitourinary: Negative for dysuria, frequency, hematuria and urgency.  Musculoskeletal: Negative for back pain, joint pain, myalgias, muscle weakness and neck pain.  Skin: Negative for rash.  Neurological: Negative for dizziness, tingling, sensory change, focal weakness and headaches.  Endo/Heme/Allergies: Negative for environmental allergies and polydipsia. Does not bruise/bleed easily.  Psychiatric/Behavioral: Positive for depression. Negative for decreased concentration and suicidal ideas. The patient is not nervous/anxious and does not have insomnia.      Objective  Vitals:   04/25/17 0912  BP: 138/78  Pulse: 80  Weight: 145 lb (65.8 kg)  Height: 5\' 6"  (1.676 m)    Physical Exam   Constitutional: She is well-developed, well-nourished, and in no distress. She is irritable. No distress.  HENT:  Head: Normocephalic and atraumatic.  Right Ear: External ear normal.  Left Ear: External ear normal.  Nose: Nose normal.  Mouth/Throat: Oropharynx is clear and moist.  Eyes: Conjunctivae and EOM are normal. Pupils are equal, round, and reactive to light. Right eye exhibits no discharge. Left eye exhibits no discharge.  Neck: Normal range of motion. Neck supple. No JVD present. No thyromegaly present.  Cardiovascular: Normal rate, regular rhythm, normal heart sounds and intact distal pulses. Exam reveals no gallop and no friction rub.  No murmur heard. Pulmonary/Chest: Effort normal and breath sounds normal. She has no wheezes. She has no rales.  Abdominal: Soft. Bowel sounds are normal. She exhibits no mass. There is no tenderness. There is no guarding.  Musculoskeletal: Normal range of motion. She exhibits no edema.  Lymphadenopathy:    She has no cervical adenopathy.  Neurological: She is alert.  Skin: Skin is warm and dry. She is not diaphoretic.  Psychiatric: Mood and affect normal.  Nursing note and vitals  reviewed.     Assessment & Plan  Problem List Items Addressed This Visit    None    Visit Diagnoses    Contact dermatitis       Relevant Medications   desoximetasone (TOPICORT) 0.25 % cream   Gastroesophageal reflux disease, esophagitis presence not specified       Relevant Medications   pantoprazole (PROTONIX) 40 MG tablet   Depression       Relevant Medications   citalopram (CELEXA) 40 MG tablet   Tinea corporis          Meds ordered this encounter  Medications  . desoximetasone (TOPICORT) 0.25 % cream    Sig: APPLY EXTERNALLY TO THE AFFECTED AREA TWICE DAILY    Dispense:  30 g    Refill:  5  . cetirizine-pseudoephedrine (ZYRTEC-D) 5-120 MG tablet    Sig: Take 1 tablet 2 (two) times daily by mouth.    Dispense:  180 tablet    Refill:  3  .  pantoprazole (PROTONIX) 40 MG tablet    Sig: TAKE 1 TABLET BY MOUTH DAILY. REFILL MEDS APPOINTMENT NEEDED.    Dispense:  30 tablet    Refill:  0  . citalopram (CELEXA) 40 MG tablet    Sig: Take 1 tablet (40 mg total) daily by mouth.    Dispense:  30 tablet    Refill:  0    Needs appt  . montelukast (SINGULAIR) 10 MG tablet    Sig: Take 1 tablet (10 mg total) at bedtime by mouth.    Dispense:  90 tablet    Refill:  1      Dr. Elizabeth Sauereanna [facility]  04/25/17

## 2017-06-19 ENCOUNTER — Other Ambulatory Visit: Payer: Self-pay | Admitting: Family Medicine

## 2017-06-19 DIAGNOSIS — F32A Depression, unspecified: Secondary | ICD-10-CM

## 2017-06-19 DIAGNOSIS — F329 Major depressive disorder, single episode, unspecified: Secondary | ICD-10-CM

## 2017-06-22 ENCOUNTER — Other Ambulatory Visit: Payer: Self-pay | Admitting: Family Medicine

## 2017-06-22 DIAGNOSIS — K219 Gastro-esophageal reflux disease without esophagitis: Secondary | ICD-10-CM

## 2017-07-23 ENCOUNTER — Other Ambulatory Visit: Payer: Self-pay | Admitting: Family Medicine

## 2017-07-23 DIAGNOSIS — L259 Unspecified contact dermatitis, unspecified cause: Secondary | ICD-10-CM

## 2017-07-23 DIAGNOSIS — B354 Tinea corporis: Secondary | ICD-10-CM

## 2017-09-04 ENCOUNTER — Other Ambulatory Visit: Payer: Self-pay | Admitting: Family Medicine

## 2017-09-04 DIAGNOSIS — F329 Major depressive disorder, single episode, unspecified: Secondary | ICD-10-CM

## 2017-09-04 DIAGNOSIS — F32A Depression, unspecified: Secondary | ICD-10-CM

## 2017-09-22 ENCOUNTER — Other Ambulatory Visit: Payer: Self-pay | Admitting: Family Medicine

## 2017-09-22 DIAGNOSIS — K219 Gastro-esophageal reflux disease without esophagitis: Secondary | ICD-10-CM

## 2018-03-22 ENCOUNTER — Ambulatory Visit
Admission: EM | Admit: 2018-03-22 | Discharge: 2018-03-22 | Disposition: A | Discharge status: Home / Self Care | Payer: BLUE CROSS/BLUE SHIELD | Attending: Family Medicine | Admitting: Family Medicine

## 2018-03-22 ENCOUNTER — Encounter: Payer: Self-pay | Admitting: Emergency Medicine

## 2018-03-22 ENCOUNTER — Other Ambulatory Visit: Payer: Self-pay

## 2018-03-22 DIAGNOSIS — J40 Bronchitis, not specified as acute or chronic: Secondary | ICD-10-CM

## 2018-03-22 DIAGNOSIS — R05 Cough: Secondary | ICD-10-CM | POA: Diagnosis not present

## 2018-03-22 DIAGNOSIS — R059 Cough, unspecified: Secondary | ICD-10-CM

## 2018-03-22 MED ORDER — DOXYCYCLINE HYCLATE 100 MG PO TABS: 100.0000 mg | ORAL_TABLET | Freq: Two times a day (BID) | ORAL | 0 refills | Status: DC

## 2018-03-22 MED ORDER — HYDROCOD POLST-CPM POLST ER 10-8 MG/5ML PO SUER: 5.0000 mL | Freq: Every evening | ORAL | 0 refills | Status: DC | PRN

## 2018-03-22 NOTE — ED Triage Notes (Signed)
Pt c/o cough, SOB, and headache. Had a slight cough that started about 3 weeks ago but has gotten worse in the last week. It is hard for her to talk without coughing and keeping her up at night.

## 2018-03-22 NOTE — ED Provider Notes (Signed)
[facility]    CSN: [id number] Arrival date & time: 03/22/18  1191     History   Chief Complaint Chief Complaint  Patient presents with  . Cough    HPI Debbie Bell is a 57 y.o. female.   The history is provided by the patient.  Cough  Associated symptoms: no wheezing   URI  Presenting symptoms: cough   Severity:  Moderate Onset quality:  Sudden Duration:  3 weeks Timing:  Constant Progression:  Worsening Chronicity:  New Relieved by:  Nothing Ineffective treatments:  OTC medications Associated symptoms: no sinus pain and no wheezing   Risk factors: sick contacts   Risk factors: not elderly, no chronic cardiac disease, no chronic kidney disease, no chronic respiratory disease, no diabetes mellitus, no immunosuppression, no recent illness and no recent travel     Past Medical History:  Diagnosis Date  . Allergy   . Depression   . GERD (gastroesophageal reflux disease)   . Hemorrhoid     There are no active problems to display for this patient.   Past Surgical History:  Procedure Laterality Date  . COLONOSCOPY  2015   polyps/ repeat in 2 yrs- Dr Servando Snare  . TONSILLECTOMY      OB History   None      Home Medications    Prior to Admission medications   Medication Sig Start Date End Date Taking? Authorizing Provider  cetirizine-pseudoephedrine (ZYRTEC-D) 5-120 MG tablet Take 1 tablet 2 (two) times daily by mouth. 04/25/17  Yes Duanne Limerick, MD  citalopram (CELEXA) 40 MG tablet TAKE 1 TABLET BY MOUTH EVERY DAY 09/05/17  Yes Duanne Limerick, MD  desoximetasone (TOPICORT) 0.25 % cream APPLY EXTERNALLY TO THE AFFECTED AREA TWICE DAILY 04/25/17  Yes Duanne Limerick, MD  hydrocortisone (ANUSOL-HC) 2.5 % rectal cream Place 1 application rectally 2 (two) times daily. PRN   Yes [provider]  meclizine (ANTIVERT) 25 MG tablet Take 25 mg by mouth 3 (three) times daily as needed for dizziness.   Yes [provider]  montelukast  (SINGULAIR) 10 MG tablet Take 1 tablet (10 mg total) at bedtime by mouth. 04/25/17  Yes Duanne Limerick, MD  pantoprazole (PROTONIX) 40 MG tablet TAKE 1 TABLET BY MOUTH EVERY DAY 09/22/17  Yes Duanne Limerick, MD  chlorpheniramine-HYDROcodone (TUSSIONEX PENNKINETIC ER) 10-8 MG/5ML SUER Take 5 mLs by mouth at bedtime as needed. 03/22/18   Payton Mccallum, MD  doxycycline (VIBRA-TABS) 100 MG tablet Take 1 tablet (100 mg total) by mouth 2 (two) times daily. 03/22/18   Payton Mccallum, MD  nystatin-triamcinolone (MYCOLOG II) cream APPLY EXTERNALLY TO THE AFFECTED AREA TWICE DAILY AS NEEDED 07/25/17   Duanne Limerick, MD    Family History Family History  Problem Relation Age of Onset  . Heart disease Father   . Cancer Paternal Aunt   . Stroke Maternal Grandmother     Social History Social History   Tobacco Use  . Smoking status: Never Smoker  . Smokeless tobacco: Never Used  Substance Use Topics  . Alcohol use: Yes    Alcohol/week: 0.0 standard drinks  . Drug use: No     Allergies   Penicillins   Review of Systems Review of Systems  HENT: Negative for sinus pain.   Respiratory: Positive for cough. Negative for wheezing.      Physical Exam Triage Vital Signs ED Triage Vitals  Enc Vitals Group     BP 03/22/18 1936 Marland Kitchen)  148/76     Pulse Rate 03/22/18 1936 94     Resp 03/22/18 1936 18     Temp 03/22/18 1936 98.4 F (36.9 C)     Temp Source 03/22/18 1936 Oral     SpO2 03/22/18 1936 100 %     Weight 03/22/18 1935 140 lb (63.5 kg)     Height 03/22/18 1935 5\' 5"  (1.651 m)     Head Circumference --      Peak Flow --      Pain Score 03/22/18 1934 5     Pain Loc --      Pain Edu? --      Excl. in GC? --    No data found.  Updated Vital Signs BP (!) 148/76 (BP Location: Left Arm)   Pulse 94   Temp 98.4 F (36.9 C) (Oral)   Resp 18   Ht 5\' 5"  (1.651 m)   Wt 63.5 kg   SpO2 100%   BMI 23.30 kg/m   Visual Acuity Right Eye Distance:   Left Eye Distance:   Bilateral  Distance:    Right Eye Near:   Left Eye Near:    Bilateral Near:     Physical Exam  Constitutional: She appears well-developed and well-nourished. No distress.  HENT:  Head: Normocephalic and atraumatic.  Nose: Mucosal edema and rhinorrhea present. No nose lacerations, sinus tenderness, nasal deformity, septal deviation or nasal septal hematoma. No epistaxis.  No foreign bodies.  Mouth/Throat: Uvula is midline, oropharynx is clear and moist and mucous membranes are normal. No oropharyngeal exudate.  Eyes: Conjunctivae are normal. Right eye exhibits no discharge. Left eye exhibits no discharge. No scleral icterus.  Neck: Normal range of motion. Neck supple. No thyromegaly present.  Cardiovascular: Normal rate, regular rhythm and normal heart sounds.  Pulmonary/Chest: Effort normal. No stridor. No respiratory distress. She has no wheezes. She has no rales.  Diffuse rhonchi  Lymphadenopathy:    She has no cervical adenopathy.  Skin: She is not diaphoretic.  Nursing note and vitals reviewed.    UC Treatments / Results  Labs (all labs ordered are listed, but only abnormal results are displayed) Labs Reviewed - No data to display  EKG None  Radiology No results found.  Procedures Procedures (including critical care time)  Medications Ordered in UC Medications - No data to display  Initial Impression / Assessment and Plan / UC Course  I have reviewed the triage vital signs and the nursing notes.  Pertinent labs & imaging results that were available during my care of the patient were reviewed by me and considered in my medical decision making (see chart for details).      Final Clinical Impressions(s) / UC Diagnoses   Final diagnoses:  Bronchitis  Cough   Discharge Instructions   None    ED Prescriptions    Medication Sig Dispense Auth. Provider   doxycycline (VIBRA-TABS) 100 MG tablet Take 1 tablet (100 mg total) by mouth 2 (two) times daily. 20 tablet Payton Mccallum, MD   chlorpheniramine-HYDROcodone (TUSSIONEX PENNKINETIC ER) 10-8 MG/5ML SUER Take 5 mLs by mouth at bedtime as needed. 60 mL Payton Mccallum, MD     1. diagnosis reviewed with patient 2. rx as per orders above; reviewed possible side effects, interactions, risks and benefits  3. Recommend supportive treatment with rest, fluids 4. Follow-up prn if symptoms worsen or don't improve   Controlled Substance Prescriptions [place] Controlled Substance Registry consulted? Not Applicable   [place], [place],  MD 03/22/18 2031

## 2018-04-07 ENCOUNTER — Ambulatory Visit
Admission: RE | Admit: 2018-04-07 | Discharge: 2018-04-07 | Disposition: A | Discharge status: Home / Self Care | Payer: BLUE CROSS/BLUE SHIELD | Source: Ambulatory Visit | Attending: Family Medicine | Admitting: Family Medicine

## 2018-04-07 ENCOUNTER — Encounter: Payer: Self-pay | Admitting: Family Medicine

## 2018-04-07 ENCOUNTER — Other Ambulatory Visit: Payer: Self-pay | Admitting: Family Medicine

## 2018-04-07 ENCOUNTER — Ambulatory Visit (INDEPENDENT_AMBULATORY_CARE_PROVIDER_SITE_OTHER): Payer: BLUE CROSS/BLUE SHIELD | Admitting: Family Medicine

## 2018-04-07 VITALS — BP 120/80 | HR 76 | Ht 65.0 in | Wt 149.0 lb

## 2018-04-07 DIAGNOSIS — K219 Gastro-esophageal reflux disease without esophagitis: Secondary | ICD-10-CM

## 2018-04-07 DIAGNOSIS — Z23 Encounter for immunization: Secondary | ICD-10-CM | POA: Diagnosis not present

## 2018-04-07 DIAGNOSIS — R05 Cough: Secondary | ICD-10-CM | POA: Diagnosis not present

## 2018-04-07 DIAGNOSIS — F324 Major depressive disorder, single episode, in partial remission: Secondary | ICD-10-CM

## 2018-04-07 DIAGNOSIS — R053 Chronic cough: Secondary | ICD-10-CM

## 2018-04-07 MED ORDER — PANTOPRAZOLE SODIUM 40 MG PO TBEC: 40.0000 mg | DELAYED_RELEASE_TABLET | Freq: Every day | ORAL | 3 refills | Status: DC

## 2018-04-07 MED ORDER — MONTELUKAST SODIUM 10 MG PO TABS: 10.0000 mg | ORAL_TABLET | Freq: Every day | ORAL | 1 refills | Status: DC

## 2018-04-07 MED ORDER — PANTOPRAZOLE SODIUM 40 MG PO TBEC: 40.0000 mg | DELAYED_RELEASE_TABLET | Freq: Every day | ORAL | 1 refills | Status: DC

## 2018-04-07 MED ORDER — ESOMEPRAZOLE MAGNESIUM 40 MG PO CPDR: 40.0000 mg | DELAYED_RELEASE_CAPSULE | Freq: Every day | ORAL | 5 refills | Status: DC

## 2018-04-07 MED ORDER — CITALOPRAM HYDROBROMIDE 40 MG PO TABS: 40.0000 mg | ORAL_TABLET | Freq: Every day | ORAL | 1 refills | Status: DC

## 2018-04-07 NOTE — Progress Notes (Signed)
Changed Nexium to pantoprazole- pt stated "will pay out of pocket for med, is aware that ins will not pay"

## 2018-04-07 NOTE — Progress Notes (Signed)
Date:  04/07/2018   Name:  Debbie Bell   DOB:  [birth year]   MRN:  [id number]   Chief Complaint: Depression (PHQ=4- been off med x 1 month); Allergic Rhinitis ; Gastroesophageal Reflux; Cough (treated with Doxy and tussionex on 03/22/18- cough is lingering. Chest xray?); and tdap needed Depression         This is a chronic problem.  The current episode started more than 1 year ago.   The onset quality is sudden.   The problem occurs intermittently.  The problem has been waxing and waning since onset.  Associated symptoms include fatigue, irritable, decreased interest and sad.  Associated symptoms include no decreased concentration, no helplessness, no hopelessness, does not have insomnia, no restlessness, no appetite change, no body aches, no myalgias, no headaches, no indigestion and no suicidal ideas.     The symptoms are aggravated by work stress.  Past treatments include SSRIs - Selective serotonin reuptake inhibitors.  Compliance with treatment is good.  Previous treatment provided mild relief. Gastroesophageal Reflux  She complains of coughing, heartburn and a hoarse voice. She reports no abdominal pain, no belching, no chest pain, no choking, no dysphagia, no nausea, no sore throat or no wheezing. Associated symptoms include fatigue. Pertinent negatives include no weight loss. The treatment provided moderate relief.  Cough  This is a recurrent problem. The current episode started more than 1 year ago. The problem has been rapidly worsening. The cough is productive of purulent sputum. Associated symptoms include heartburn, hemoptysis, nasal congestion and postnasal drip. Pertinent negatives include no chest pain, chills, ear congestion, ear pain, eye redness, fever, headaches, myalgias, rash, rhinorrhea, sore throat, shortness of breath, sweats, weight loss or wheezing. The symptoms are aggravated by pollens and cold air. She has tried leukotriene antagonists and OTC cough suppressant for the  symptoms. The treatment provided moderate relief. There is no history of environmental allergies.     Review of Systems  Constitutional: Positive for fatigue. Negative for appetite change, chills, fever, unexpected weight change and weight loss.  HENT: Positive for hoarse voice and postnasal drip. Negative for congestion, ear discharge, ear pain, rhinorrhea, sinus pressure, sneezing and sore throat.   Eyes: Negative for photophobia, pain, discharge, redness and itching.  Respiratory: Positive for cough and hemoptysis. Negative for apnea, choking, chest tightness, shortness of breath, wheezing and stridor.   Cardiovascular: Negative for chest pain, palpitations and leg swelling.  Gastrointestinal: Positive for heartburn. Negative for abdominal pain, blood in stool, constipation, diarrhea, dysphagia, nausea and vomiting.  Endocrine: Negative for cold intolerance, heat intolerance, polydipsia, polyphagia and polyuria.  Genitourinary: Negative for dysuria, flank pain, frequency, hematuria, menstrual problem, pelvic pain, urgency, vaginal bleeding and vaginal discharge.  Musculoskeletal: Negative for arthralgias, back pain and myalgias.  Skin: Negative for rash.  Allergic/Immunologic: Negative for environmental allergies and food allergies.  Neurological: Negative for dizziness, weakness, light-headedness, numbness and headaches.  Hematological: Negative for adenopathy. Does not bruise/bleed easily.  Psychiatric/Behavioral: Positive for depression. Negative for decreased concentration, dysphoric mood and suicidal ideas. The patient is not nervous/anxious and does not have insomnia.     There are no active problems to display for this patient.   Allergies  Allergen Reactions  . Penicillins     Past Surgical History:  Procedure Laterality Date  . COLONOSCOPY  2015   polyps/ repeat in 2 yrs- Dr Servando Snare  . TONSILLECTOMY      Social History   Tobacco Use  . Smoking status: Never Smoker  .  Smokeless tobacco: Never Used  Substance Use Topics  . Alcohol use: Yes    Alcohol/week: 0.0 standard drinks  . Drug use: No     Medication list has been reviewed and updated.  Current Meds  Medication Sig  . cetirizine-pseudoephedrine (ZYRTEC-D) 5-120 MG tablet Take 1 tablet 2 (two) times daily by mouth.  . citalopram (CELEXA) 40 MG tablet Take 1 tablet (40 mg total) by mouth daily.  Marland Kitchen desoximetasone (TOPICORT) 0.25 % cream APPLY EXTERNALLY TO THE AFFECTED AREA TWICE DAILY  . hydrocortisone (ANUSOL-HC) 2.5 % rectal cream Place 1 application rectally 2 (two) times daily. PRN  . meclizine (ANTIVERT) 25 MG tablet Take 25 mg by mouth 3 (three) times daily as needed for dizziness.  . montelukast (SINGULAIR) 10 MG tablet Take 1 tablet (10 mg total) by mouth at bedtime.  Marland Kitchen nystatin-triamcinolone (MYCOLOG II) cream APPLY EXTERNALLY TO THE AFFECTED AREA TWICE DAILY AS NEEDED  . pantoprazole (PROTONIX) 40 MG tablet Take 1 tablet (40 mg total) by mouth daily.  . [DISCONTINUED] citalopram (CELEXA) 40 MG tablet TAKE 1 TABLET BY MOUTH EVERY DAY  . [DISCONTINUED] montelukast (SINGULAIR) 10 MG tablet Take 1 tablet (10 mg total) at bedtime by mouth.  . [DISCONTINUED] pantoprazole (PROTONIX) 40 MG tablet TAKE 1 TABLET BY MOUTH EVERY DAY    PHQ 2/9 Scores 04/07/2018 10/23/2015  PHQ - 2 Score 1 0  PHQ- 9 Score 4 -    Physical Exam  Constitutional: She is oriented to person, place, and time. Vital signs are normal. She appears well-developed and well-nourished. She is irritable.  HENT:  Head: Normocephalic.  Right Ear: Hearing, tympanic membrane, external ear and ear canal normal.  Left Ear: Hearing, tympanic membrane, external ear and ear canal normal.  Nose: No mucosal edema or rhinorrhea. Right sinus exhibits no maxillary sinus tenderness. Left sinus exhibits no maxillary sinus tenderness.  Mouth/Throat: Uvula is midline and oropharynx is clear and moist. No oropharyngeal exudate, posterior  oropharyngeal edema or posterior oropharyngeal erythema.  Eyes: Pupils are equal, round, and reactive to light. Conjunctivae and EOM are normal. Lids are everted and swept, no foreign bodies found. Left eye exhibits no hordeolum. No foreign body present in the left eye. Right conjunctiva is not injected. Left conjunctiva is not injected. No scleral icterus.  Neck: Normal range of motion. Neck supple. No JVD present. No tracheal deviation present. No thyromegaly present.  Cardiovascular: Normal rate, regular rhythm, S1 normal, S2 normal, normal heart sounds and intact distal pulses. Exam reveals no gallop, no S3, no S4, no distant heart sounds and no friction rub.  No murmur heard.  No systolic murmur is present.  No diastolic murmur is present. Pulses:      Carotid pulses are 2+ on the right side, and 2+ on the left side.      Radial pulses are 2+ on the right side, and 2+ on the left side.       Femoral pulses are 2+ on the right side, and 2+ on the left side.      Popliteal pulses are 2+ on the right side, and 2+ on the left side.       Dorsalis pedis pulses are 2+ on the right side, and 2+ on the left side.       Posterior tibial pulses are 2+ on the right side, and 2+ on the left side.  Pulmonary/Chest: Effort normal and breath sounds normal. No stridor. No respiratory distress. She has no decreased breath sounds. She  has no wheezes. She has no rhonchi. She has no rales.  Abdominal: Soft. Bowel sounds are normal. She exhibits no mass. There is no hepatosplenomegaly. There is no tenderness. There is no rebound and no guarding.  Musculoskeletal: Normal range of motion. She exhibits no edema or tenderness.  Lymphadenopathy:    She has no cervical adenopathy.  Neurological: She is alert and oriented to person, place, and time. She has normal strength. She displays normal reflexes. No cranial nerve deficit.  Skin: Skin is warm. No rash noted.  Psychiatric: She has a normal mood and affect. Her  mood appears not anxious. She does not exhibit a depressed mood.  Nursing note and vitals reviewed.   BP 120/80   Pulse 76   Ht 5\' 5"  (1.651 m)   Wt 149 lb (67.6 kg)   BMI 24.79 kg/m   Assessment and Plan:  1. Depression Stable on med- refill citalopram - citalopram (CELEXA) 40 MG tablet; Take 1 tablet (40 mg total) by mouth daily.  Dispense: 90 tablet; Refill: 1  2. Gastroesophageal reflux disease, esophagitis presence not specified Stable on med- refill pantoprazole - pantoprazole (PROTONIX) 40 MG tablet; Take 1 tablet (40 mg total) by mouth daily.  Dispense: 90 tablet; Refill: 1  3. Persistent cough Prescribe Singulair/ ordered chest xray - montelukast (SINGULAIR) 10 MG tablet; Take 1 tablet (10 mg total) by mouth at bedtime.  Dispense: 90 tablet; Refill: 1 - DG Chest 2 View; Future  4. Need for diphtheria-tetanus-pertussis (Tdap) vaccine administered - Tdap vaccine greater than or equal to 7yo IM   Dr. Hayden [facility]  04/07/2018

## 2018-04-10 ENCOUNTER — Other Ambulatory Visit: Payer: Self-pay | Admitting: Family Medicine

## 2018-04-10 DIAGNOSIS — F32A Depression, unspecified: Secondary | ICD-10-CM

## 2018-04-10 DIAGNOSIS — F329 Major depressive disorder, single episode, unspecified: Secondary | ICD-10-CM

## 2018-06-19 ENCOUNTER — Telehealth: Payer: Self-pay

## 2018-06-19 NOTE — Telephone Encounter (Signed)
Pt stated she is paying out of pocket for pantoprazole

## 2018-07-19 ENCOUNTER — Other Ambulatory Visit: Payer: Self-pay | Admitting: Family Medicine

## 2018-07-19 DIAGNOSIS — B354 Tinea corporis: Secondary | ICD-10-CM

## 2018-07-19 DIAGNOSIS — L259 Unspecified contact dermatitis, unspecified cause: Secondary | ICD-10-CM

## 2018-08-17 ENCOUNTER — Other Ambulatory Visit: Payer: Self-pay | Admitting: Family Medicine

## 2018-08-17 DIAGNOSIS — K219 Gastro-esophageal reflux disease without esophagitis: Secondary | ICD-10-CM

## 2018-08-25 LAB — RESULTS CONSOLE HPV: CHL HPV: NEGATIVE

## 2018-08-25 LAB — HM PAP SMEAR: HM Pap smear: NORMAL

## 2018-09-22 ENCOUNTER — Other Ambulatory Visit: Payer: Self-pay | Admitting: Family Medicine

## 2018-09-22 DIAGNOSIS — R053 Chronic cough: Secondary | ICD-10-CM

## 2018-09-22 DIAGNOSIS — R05 Cough: Secondary | ICD-10-CM

## 2018-10-18 ENCOUNTER — Other Ambulatory Visit: Payer: Self-pay | Admitting: Family Medicine

## 2018-10-19 ENCOUNTER — Other Ambulatory Visit: Payer: Self-pay

## 2018-10-19 ENCOUNTER — Encounter: Payer: Self-pay | Admitting: Family Medicine

## 2018-10-19 ENCOUNTER — Ambulatory Visit (INDEPENDENT_AMBULATORY_CARE_PROVIDER_SITE_OTHER): Payer: BLUE CROSS/BLUE SHIELD | Admitting: Family Medicine

## 2018-10-19 VITALS — BP 120/70 | HR 84 | Ht 65.0 in | Wt 148.0 lb

## 2018-10-19 DIAGNOSIS — K219 Gastro-esophageal reflux disease without esophagitis: Secondary | ICD-10-CM

## 2018-10-19 DIAGNOSIS — R05 Cough: Secondary | ICD-10-CM

## 2018-10-19 DIAGNOSIS — F324 Major depressive disorder, single episode, in partial remission: Secondary | ICD-10-CM | POA: Diagnosis not present

## 2018-10-19 DIAGNOSIS — R053 Chronic cough: Secondary | ICD-10-CM

## 2018-10-19 DIAGNOSIS — R5383 Other fatigue: Secondary | ICD-10-CM

## 2018-10-19 MED ORDER — PANTOPRAZOLE SODIUM 40 MG PO TBEC: DELAYED_RELEASE_TABLET | ORAL | 1 refills | Status: DC

## 2018-10-19 MED ORDER — CITALOPRAM HYDROBROMIDE 40 MG PO TABS: 40.0000 mg | ORAL_TABLET | Freq: Every day | ORAL | 1 refills | Status: DC

## 2018-10-19 MED ORDER — CETIRIZINE-PSEUDOEPHEDRINE ER 5-120 MG PO TB12: 1.0000 | ORAL_TABLET | Freq: Two times a day (BID) | ORAL | 3 refills | Status: DC

## 2018-10-19 MED ORDER — MONTELUKAST SODIUM 10 MG PO TABS: ORAL_TABLET | ORAL | 3 refills | Status: DC

## 2018-10-19 NOTE — Progress Notes (Signed)
Date:  10/19/2018   Name:  Debbie Bell   DOB:  1961-02-07   MRN:  [id number]   Chief Complaint: Allergic Rhinitis ; Depression (PHQ9=0/ no energy); and Gastroesophageal Reflux  Depression         This is a chronic problem.  The current episode started more than 1 year ago.   The onset quality is undetermined.   The problem occurs intermittently.  The problem has been gradually improving since onset.  Associated symptoms include no decreased concentration, no fatigue, no helplessness, no hopelessness, does not have insomnia, not irritable, no restlessness, no decreased interest, no appetite change, no body aches, no myalgias, no headaches, no indigestion, not sad and no suicidal ideas.( PHQ-0)     The symptoms are aggravated by medication.  Past treatments include SSRIs - Selective serotonin reuptake inhibitors.  Compliance with treatment is good.  Previous treatment provided mild relief.  Past medical history includes thyroid problem.   Gastroesophageal Reflux  She complains of heartburn. She reports no abdominal pain, no belching, no chest pain, no choking, no coughing, no dysphagia, no early satiety, no globus sensation, no hoarse voice, no nausea, no sore throat, no stridor, no tooth decay, no water brash or no wheezing. This is a recurrent problem. The current episode started more than 1 year ago. The problem occurs constantly. The problem has been waxing and waning. The symptoms are aggravated by certain foods. Pertinent negatives include no fatigue or weight loss. She has tried a PPI for the symptoms. The treatment provided moderate relief.  Thyroid Problem  Presents for initial (for fatigue x 1 year) visit. Patient reports no anxiety, cold intolerance, constipation, depressed mood, diaphoresis, diarrhea, dry skin, fatigue, hair loss, heat intolerance, hoarse voice, leg swelling, menstrual problem, nail problem, palpitations, tremors, visual change, weight gain or weight loss. The symptoms  have been stable. Past treatments include nothing. The treatment provided moderate relief.  URI   This is a chronic (for allergic rhinitis) problem. The current episode started more than 1 year ago. The problem has been waxing and waning. There has been no fever. Associated symptoms include rhinorrhea and sneezing. Pertinent negatives include no abdominal pain, chest pain, coughing, diarrhea, dysuria, ear pain, headaches, nausea, neck pain, plugged ear sensation, rash, sinus pain, sore throat or wheezing.    Review of Systems  Constitutional: Negative for appetite change, chills, diaphoresis, fatigue, fever, weight gain and weight loss.  HENT: Positive for rhinorrhea and sneezing. Negative for drooling, ear discharge, ear pain, hoarse voice, sinus pain and sore throat.   Respiratory: Negative for cough, choking, shortness of breath and wheezing.   Cardiovascular: Negative for chest pain, palpitations and leg swelling.  Gastrointestinal: Positive for heartburn. Negative for abdominal pain, blood in stool, constipation, diarrhea, dysphagia and nausea.  Endocrine: Negative for cold intolerance, heat intolerance and polydipsia.  Genitourinary: Negative for dysuria, frequency, hematuria, menstrual problem and urgency.  Musculoskeletal: Negative for back pain, myalgias and neck pain.  Skin: Negative for rash.  Allergic/Immunologic: Negative for environmental allergies.  Neurological: Negative for dizziness, tremors and headaches.  Hematological: Does not bruise/bleed easily.  Psychiatric/Behavioral: Positive for depression. Negative for decreased concentration and suicidal ideas. The patient is not nervous/anxious and does not have insomnia.     There are no active problems to display for this patient.   Allergies  Allergen Reactions  . Penicillins     Past Surgical History:  Procedure Laterality Date  . COLONOSCOPY  2015   polyps/ repeat  in 2 yrs- Dr Servando SnareWohl  . TONSILLECTOMY      Social  History   Tobacco Use  . Smoking status: Never Smoker  . Smokeless tobacco: Never Used  Substance Use Topics  . Alcohol use: Yes    Alcohol/week: 0.0 standard drinks  . Drug use: No     Medication list has been reviewed and updated.  Current Meds  Medication Sig  . cetirizine-pseudoephedrine (ZYRTEC-D) 5-120 MG tablet Take 1 tablet 2 (two) times daily by mouth.  . citalopram (CELEXA) 40 MG tablet Take 1 tablet (40 mg total) by mouth daily.  Marland Kitchen. desoximetasone (TOPICORT) 0.25 % cream APPLY EXTERNALLY TO THE AFFECTED AREA TWICE DAILY  . hydrocortisone (ANUSOL-HC) 2.5 % rectal cream Place 1 application rectally 2 (two) times daily. PRN  . meclizine (ANTIVERT) 25 MG tablet Take 25 mg by mouth 3 (three) times daily as needed for dizziness.  . montelukast (SINGULAIR) 10 MG tablet TAKE 1 TABLET(10 MG) BY MOUTH AT BEDTIME  . nystatin-triamcinolone (MYCOLOG II) cream APPLY EXTERNALLY TO THE AFFECTED AREA TWICE DAILY AS NEEDED  . pantoprazole (PROTONIX) 40 MG tablet TAKE 1 TABLET(40 MG) BY MOUTH DAILY    PHQ 2/9 Scores 10/19/2018 04/07/2018 10/23/2015  PHQ - 2 Score 0 1 0  PHQ- 9 Score 0 4 -    BP Readings from Last 3 Encounters:  10/19/18 120/70  04/07/18 120/80  03/22/18 (!) 148/76    Physical Exam Vitals signs and nursing note reviewed.  Constitutional:      General: She is not irritable.    Appearance: She is well-developed.  HENT:     Head: Normocephalic.     Right Ear: Tympanic membrane, ear canal and external ear normal.     Left Ear: Tympanic membrane, ear canal and external ear normal.     Nose: No congestion or rhinorrhea.  Eyes:     General: Lids are everted, no foreign bodies appreciated. No scleral icterus.       Left eye: No foreign body or hordeolum.     Conjunctiva/sclera: Conjunctivae normal.     Right eye: Right conjunctiva is not injected.     Left eye: Left conjunctiva is not injected.     Pupils: Pupils are equal, round, and reactive to light.  Neck:      Musculoskeletal: Normal range of motion and neck supple.     Thyroid: No thyromegaly.     Vascular: No JVD.     Trachea: No tracheal deviation.  Cardiovascular:     Rate and Rhythm: Tachycardia present. Rhythm irregular.     Heart sounds: Normal heart sounds. No murmur. No friction rub. No gallop.   Pulmonary:     Effort: Pulmonary effort is normal. No respiratory distress.     Breath sounds: Normal breath sounds. No wheezing, rhonchi or rales.  Chest:     Chest wall: No tenderness.  Abdominal:     General: Bowel sounds are normal.     Palpations: Abdomen is soft. There is no mass.     Tenderness: There is no abdominal tenderness. There is no guarding or rebound.  Musculoskeletal: Normal range of motion.        General: No tenderness.  Lymphadenopathy:     Cervical: No cervical adenopathy.  Skin:    General: Skin is warm.     Findings: No rash.  Neurological:     Mental Status: She is alert and oriented to person, place, and time.     Cranial Nerves: No  cranial nerve deficit.     Deep Tendon Reflexes: Reflexes normal.  Psychiatric:        Mood and Affect: Mood is not anxious or depressed.     Wt Readings from Last 3 Encounters:  10/19/18 148 lb (67.1 kg)  04/07/18 149 lb (67.6 kg)  03/22/18 140 lb (63.5 kg)    BP 120/70   Pulse 84   Ht 5\' 5"  (1.651 m)   Wt 148 lb (67.1 kg)   BMI 24.63 kg/m   Assessment and Plan:  1. Gastroesophageal reflux disease, esophagitis presence not specified Chronic.  Controlled.  Continue pantoprazole 40 mg once a day - pantoprazole (PROTONIX) 40 MG tablet; TAKE 1 TABLET(40 MG) BY MOUTH DAILY  Dispense: 90 tablet; Refill: 1  2. Major depressive disorder in partial remission, unspecified whether recurrent (HCC) Chronic.  Controlled.  PHQ 9 with a score of 0.  Continue citalopram 40 mg once a day - citalopram (CELEXA) 40 MG tablet; Take 1 tablet (40 mg total) by mouth daily.  Dispense: 90 tablet; Refill: 1  3. Persistent cough Persistent  cough secondary to reactive airway disease controlled with Singulair 10 mg once a day - montelukast (SINGULAIR) 10 MG tablet; TAKE 1 TABLET(10 MG) BY MOUTH AT BEDTIME  Dispense: 90 tablet; Refill: 3  4. Fatigue, unspecified type Patient has new onset of fatigue.  Since depression is under good control we will look at other reasons for fatigue including thyroid/renal renal insufficiency/and anemia.  Will check TSH renal function panel and CBC. - TSH - Renal Function Panel - CBC with Differential/Platelet

## 2018-10-20 LAB — CBC WITH DIFFERENTIAL/PLATELET
Basophils Absolute: 0.1 10*3/uL (ref 0.0–0.2)
Basos: 1 %
EOS (ABSOLUTE): 0.1 10*3/uL (ref 0.0–0.4)
Eos: 2 %
Hematocrit: 41.2 % (ref 34.0–46.6)
Hemoglobin: 14.4 g/dL (ref 11.1–15.9)
Immature Grans (Abs): 0 10*3/uL (ref 0.0–0.1)
Immature Granulocytes: 0 %
Lymphocytes Absolute: 2 10*3/uL (ref 0.7–3.1)
Lymphs: 31 %
MCH: 31.4 pg (ref 26.6–33.0)
MCHC: 35 g/dL (ref 31.5–35.7)
MCV: 90 fL (ref 79–97)
Monocytes Absolute: 0.6 10*3/uL (ref 0.1–0.9)
Monocytes: 9 %
Neutrophils Absolute: 3.7 10*3/uL (ref 1.4–7.0)
Neutrophils: 57 %
Platelets: 383 10*3/uL (ref 150–450)
RBC: 4.59 x10E6/uL (ref 3.77–5.28)
RDW: 12.3 % (ref 11.7–15.4)
WBC: 6.4 10*3/uL (ref 3.4–10.8)

## 2018-10-20 LAB — RENAL FUNCTION PANEL
Albumin: 4.6 g/dL (ref 3.8–4.9)
BUN/Creatinine Ratio: 15 (ref 9–23)
BUN: 12 mg/dL (ref 6–24)
CO2: 25 mmol/L (ref 20–29)
Calcium: 9.5 mg/dL (ref 8.7–10.2)
Chloride: 105 mmol/L (ref 96–106)
Creatinine, Ser: 0.8 mg/dL (ref 0.57–1.00)
GFR calc Af Amer: 95 mL/min/{1.73_m2} (ref >59)
GFR calc non Af Amer: 83 mL/min/{1.73_m2} (ref >59)
Glucose: 75 mg/dL (ref 65–99)
Phosphorus: 3.3 mg/dL (ref 3.0–4.3)
Potassium: 3.7 mmol/L (ref 3.5–5.2)
Sodium: 145 mmol/L — ABNORMAL HIGH (ref 134–144)

## 2018-10-20 LAB — TSH: TSH: 0.641 u[IU]/mL (ref 0.450–4.500)

## 2018-10-24 LAB — T3 UPTAKE
Free Thyroxine Index: 1.9 (ref 1.2–4.9)
T3 Uptake Ratio: 24 % (ref 24–39)

## 2018-10-24 LAB — T4: T4, Total: 7.9 ug/dL (ref 4.5–12.0)

## 2018-10-24 LAB — SPECIMEN STATUS REPORT

## 2018-10-31 ENCOUNTER — Other Ambulatory Visit: Payer: Self-pay

## 2018-10-31 DIAGNOSIS — K219 Gastro-esophageal reflux disease without esophagitis: Secondary | ICD-10-CM

## 2018-10-31 MED ORDER — PANTOPRAZOLE SODIUM 40 MG PO TBEC: DELAYED_RELEASE_TABLET | ORAL | 1 refills | Status: DC

## 2018-12-16 ENCOUNTER — Other Ambulatory Visit: Payer: Self-pay | Admitting: Family Medicine

## 2018-12-16 DIAGNOSIS — F324 Major depressive disorder, single episode, in partial remission: Secondary | ICD-10-CM

## 2019-04-10 ENCOUNTER — Other Ambulatory Visit: Payer: Self-pay

## 2019-04-10 ENCOUNTER — Telehealth: Payer: Self-pay | Admitting: Obstetrics & Gynecology

## 2019-04-10 ENCOUNTER — Encounter: Payer: Self-pay | Admitting: Family Medicine

## 2019-04-10 ENCOUNTER — Ambulatory Visit (INDEPENDENT_AMBULATORY_CARE_PROVIDER_SITE_OTHER): Payer: BC Managed Care – PPO | Admitting: Family Medicine

## 2019-04-10 VITALS — BP 120/80 | HR 80 | Ht 65.0 in | Wt 141.0 lb

## 2019-04-10 DIAGNOSIS — L2084 Intrinsic (allergic) eczema: Secondary | ICD-10-CM

## 2019-04-10 DIAGNOSIS — F324 Major depressive disorder, single episode, in partial remission: Secondary | ICD-10-CM | POA: Diagnosis not present

## 2019-04-10 DIAGNOSIS — Z23 Encounter for immunization: Secondary | ICD-10-CM

## 2019-04-10 DIAGNOSIS — K219 Gastro-esophageal reflux disease without esophagitis: Secondary | ICD-10-CM | POA: Diagnosis not present

## 2019-04-10 DIAGNOSIS — J301 Allergic rhinitis due to pollen: Secondary | ICD-10-CM | POA: Diagnosis not present

## 2019-04-10 DIAGNOSIS — R05 Cough: Secondary | ICD-10-CM | POA: Insufficient documentation

## 2019-04-10 DIAGNOSIS — Z0000 Encounter for general adult medical examination without abnormal findings: Secondary | ICD-10-CM

## 2019-04-10 DIAGNOSIS — R5383 Other fatigue: Secondary | ICD-10-CM

## 2019-04-10 DIAGNOSIS — R053 Chronic cough: Secondary | ICD-10-CM | POA: Insufficient documentation

## 2019-04-10 MED ORDER — DESOXIMETASONE 0.25 % EX CREA: TOPICAL_CREAM | CUTANEOUS | 1 refills | Status: DC

## 2019-04-10 MED ORDER — MONTELUKAST SODIUM 10 MG PO TABS: ORAL_TABLET | ORAL | 3 refills | Status: DC

## 2019-04-10 MED ORDER — CITALOPRAM HYDROBROMIDE 40 MG PO TABS: 40.0000 mg | ORAL_TABLET | Freq: Every day | ORAL | 1 refills | Status: DC

## 2019-04-10 MED ORDER — PANTOPRAZOLE SODIUM 40 MG PO TBEC: DELAYED_RELEASE_TABLET | ORAL | 1 refills | Status: DC

## 2019-04-10 NOTE — Telephone Encounter (Signed)
Patient scheduled 11/12 with [facility] in [place].

## 2019-04-10 NOTE — Progress Notes (Signed)
Date:  04/10/2019   Name:  Debbie Bell   DOB:  [birth year]   MRN:  [id number]   Chief Complaint: Allergic Rhinitis , Gastroesophageal Reflux, Depression, and ref gyn ([facility])  Gastroesophageal Reflux She reports no abdominal pain, no belching, no chest pain, no choking, no coughing, no dysphagia, no early satiety, no globus sensation, no heartburn, no hoarse voice, no nausea, no sore throat, no stridor, no water brash or no wheezing. This is a chronic problem. The current episode started more than 1 year ago. The problem occurs frequently. The problem has been waxing and waning. The symptoms are aggravated by certain foods, stress and lying down. Associated symptoms include fatigue. Pertinent negatives include no anemia, melena, muscle weakness, orthopnea or weight loss. Risk factors include lack of exercise. She has tried a PPI and a histamine-2 antagonist for the symptoms. The treatment provided moderate relief. Past procedures include a UGI.  Depression        This is a chronic problem.  The current episode started more than 1 year ago.   The problem has been gradually improving since onset.  Associated symptoms include fatigue.  Associated symptoms include no decreased concentration, no helplessness, no hopelessness, does not have insomnia, not irritable, no restlessness, no decreased interest, no appetite change, no body aches, no myalgias, no headaches, no indigestion, not sad and no suicidal ideas.     Exacerbated by: other stress.  Past treatments include SSRIs - Selective serotonin reuptake inhibitors.  Compliance with treatment is good.  Previous treatment provided moderate relief.   Review of Systems  Constitutional: Positive for fatigue. Negative for appetite change, chills, fever, unexpected weight change and weight loss.  HENT: Negative for congestion, ear discharge, ear pain, hoarse voice, rhinorrhea, sinus pressure, sneezing and sore throat.   Eyes: Negative for photophobia,  pain, discharge, redness and itching.  Respiratory: Negative for cough, choking, shortness of breath, wheezing and stridor.   Cardiovascular: Negative for chest pain.  Gastrointestinal: Negative for abdominal pain, blood in stool, constipation, diarrhea, dysphagia, heartburn, melena, nausea and vomiting.  Endocrine: Negative for cold intolerance, heat intolerance, polydipsia, polyphagia and polyuria.  Genitourinary: Negative for dysuria, flank pain, frequency, hematuria, menstrual problem, pelvic pain, urgency, vaginal bleeding and vaginal discharge.  Musculoskeletal: Negative for arthralgias, back pain, myalgias and muscle weakness.  Skin: Negative for rash.  Allergic/Immunologic: Negative for environmental allergies and food allergies.  Neurological: Negative for dizziness, weakness, light-headedness, numbness and headaches.  Hematological: Negative for adenopathy. Does not bruise/bleed easily.  Psychiatric/Behavioral: Positive for depression. Negative for decreased concentration, dysphoric mood and suicidal ideas. The patient is not nervous/anxious and does not have insomnia.     There are no active problems to display for this patient.   Allergies  Allergen Reactions  . Penicillins     Past Surgical History:  Procedure Laterality Date  . COLONOSCOPY  2015   polyps/ repeat in 2 yrs- Dr Servando Snare  . TONSILLECTOMY      Social History   Tobacco Use  . Smoking status: Never Smoker  . Smokeless tobacco: Never Used  Substance Use Topics  . Alcohol use: Yes    Alcohol/week: 0.0 standard drinks  . Drug use: No     Medication list has been reviewed and updated.  Current Meds  Medication Sig  . cetirizine-pseudoephedrine (ZYRTEC-D) 5-120 MG tablet Take 1 tablet by mouth 2 (two) times daily.  . citalopram (CELEXA) 40 MG tablet TAKE 1 TABLET BY MOUTH DAILY  . desoximetasone (TOPICORT)  0.25 % cream APPLY EXTERNALLY TO THE AFFECTED AREA TWICE DAILY  . hydrocortisone (ANUSOL-HC) 2.5 %  rectal cream Place 1 application rectally 2 (two) times daily. PRN  . meclizine (ANTIVERT) 25 MG tablet Take 25 mg by mouth 3 (three) times daily as needed for dizziness.  . montelukast (SINGULAIR) 10 MG tablet TAKE 1 TABLET(10 MG) BY MOUTH AT BEDTIME  . nystatin-triamcinolone (MYCOLOG II) cream APPLY EXTERNALLY TO THE AFFECTED AREA TWICE DAILY AS NEEDED  . pantoprazole (PROTONIX) 40 MG tablet TAKE 1 TABLET(40 MG) BY MOUTH DAILY    PHQ 2/9 Scores 04/10/2019 10/19/2018 04/07/2018 10/23/2015  PHQ - 2 Score 0 0 1 0  PHQ- 9 Score 2 0 4 -    BP Readings from Last 3 Encounters:  04/10/19 120/80  10/19/18 120/70  04/07/18 120/80    Physical Exam Vitals signs and nursing note reviewed.  Constitutional:      General: She is not irritable.    Appearance: She is well-developed.  HENT:     Head: Normocephalic.     Right Ear: Tympanic membrane, ear canal and external ear normal.     Left Ear: Tympanic membrane, ear canal and external ear normal.     Nose: Nose normal.     Mouth/Throat:     Mouth: Mucous membranes are moist.  Eyes:     General: Lids are everted, no foreign bodies appreciated. No scleral icterus.       Left eye: No foreign body or hordeolum.     Conjunctiva/sclera: Conjunctivae normal.     Right eye: Right conjunctiva is not injected.     Left eye: Left conjunctiva is not injected.     Pupils: Pupils are equal, round, and reactive to light.  Neck:     Musculoskeletal: Normal range of motion and neck supple.     Thyroid: No thyromegaly.     Vascular: No JVD.     Trachea: No tracheal deviation.  Cardiovascular:     Rate and Rhythm: Normal rate and regular rhythm.     Heart sounds: Normal heart sounds. No murmur. No friction rub. No gallop.   Pulmonary:     Effort: Pulmonary effort is normal. No respiratory distress.     Breath sounds: Normal breath sounds. No wheezing, rhonchi or rales.  Abdominal:     General: Bowel sounds are normal.     Palpations: Abdomen is soft.  There is no mass.     Tenderness: There is no abdominal tenderness. There is no guarding or rebound.  Musculoskeletal: Normal range of motion.        General: No tenderness.  Lymphadenopathy:     Cervical: No cervical adenopathy.  Skin:    General: Skin is warm.     Capillary Refill: Capillary refill takes less than 2 seconds.     Findings: No rash.  Neurological:     Mental Status: She is alert and oriented to person, place, and time.     Cranial Nerves: No cranial nerve deficit.     Deep Tendon Reflexes: Reflexes normal.  Psychiatric:        Mood and Affect: Mood is not anxious or depressed.     Wt Readings from Last 3 Encounters:  04/10/19 141 lb (64 kg)  10/19/18 148 lb (67.1 kg)  04/07/18 149 lb (67.6 kg)    BP 120/80   Pulse 80   Ht 5\' 5"  (1.651 m)   Wt 141 lb (64 kg)   BMI 23.46 kg/m  Assessment and Plan:  1. Contact dermatitis Patient with history of recurrent contact dermatitis.  Will refill Topicort 0.25% twice a day. - desoximetasone (TOPICORT) 0.25 % cream; Apply bid prn  Dispense: 30 g; Refill: 1  2. Major depressive disorder in partial remission, unspecified whether recurrent (HCC) Patient has a PHQ score of 2 with a gad score of 0.  Patient still has some fatigue.  We will refill her citalopram 40 mg once a day - citalopram (CELEXA) 40 MG tablet; Take 1 tablet (40 mg total) by mouth daily.  Dispense: 90 tablet; Refill: 1  3. Seasonal allergic rhinitis due to pollen Patient has recurrent seasonal allergy which is covered by Singulair 10 mg once a day. - montelukast (SINGULAIR) 10 MG tablet; TAKE 1 TABLET(10 MG) BY MOUTH AT BEDTIME  Dispense: 90 tablet; Refill: 3  4. Gastroesophageal reflux disease, unspecified whether esophagitis present Chronic.  Controlled.  Continue pantoprazole 40 mg once a day. - pantoprazole (PROTONIX) 40 MG tablet; TAKE 1 TABLET(40 MG) BY MOUTH DAILY  Dispense: 90 tablet; Refill: 1  5. Fatigue, unspecified type Patient does  have recurrence of her fatigue and we will look a little closer out a comprehensive metabolic panel and a sed rate. - Sedimentation rate - Comprehensive metabolic panel  6. Healthcare maintenance Patient needs referral to gynecology for Pap smear maintenance. - Ambulatory referral to Gynecology  7. Need for shingles vaccine Patient desires to initiate Shingrix for prophylaxis from varicella. - Varicella-zoster vaccine IM (Shingrix)

## 2019-04-10 NOTE — Telephone Encounter (Signed)
[facility] referring for Healthcare maintenance. ([place])Called and left voicemail for patient to call back to be schedule

## 2019-04-11 LAB — COMPREHENSIVE METABOLIC PANEL
ALT: 14 IU/L (ref 0–32)
AST: 21 IU/L (ref 0–40)
Albumin/Globulin Ratio: 1.6 (ref 1.2–2.2)
Albumin: 4.5 g/dL (ref 3.8–4.9)
Alkaline Phosphatase: 96 IU/L (ref 39–117)
BUN/Creatinine Ratio: 18 (ref 9–23)
BUN: 14 mg/dL (ref 6–24)
Bilirubin Total: 0.4 mg/dL (ref 0.0–1.2)
CO2: 26 mmol/L (ref 20–29)
Calcium: 9.8 mg/dL (ref 8.7–10.2)
Chloride: 99 mmol/L (ref 96–106)
Creatinine, Ser: 0.79 mg/dL (ref 0.57–1.00)
GFR calc Af Amer: 97 mL/min/{1.73_m2} (ref >59)
GFR calc non Af Amer: 84 mL/min/{1.73_m2} (ref >59)
Globulin, Total: 2.9 g/dL (ref 1.5–4.5)
Glucose: 84 mg/dL (ref 65–99)
Potassium: 4.3 mmol/L (ref 3.5–5.2)
Sodium: 138 mmol/L (ref 134–144)
Total Protein: 7.4 g/dL (ref 6.0–8.5)

## 2019-04-11 LAB — SEDIMENTATION RATE: Sed Rate: 6 mm/hr (ref 0–40)

## 2019-04-19 ENCOUNTER — Other Ambulatory Visit: Payer: Self-pay

## 2019-04-19 ENCOUNTER — Encounter: Payer: Self-pay | Admitting: Advanced Practice Midwife

## 2019-04-19 ENCOUNTER — Ambulatory Visit (INDEPENDENT_AMBULATORY_CARE_PROVIDER_SITE_OTHER): Payer: BC Managed Care – PPO | Admitting: Advanced Practice Midwife

## 2019-04-19 ENCOUNTER — Other Ambulatory Visit (HOSPITAL_COMMUNITY)
Admission: RE | Admit: 2019-04-19 | Discharge: 2019-04-19 | Disposition: A | Discharge status: Home / Self Care | Payer: BC Managed Care – PPO | Source: Ambulatory Visit | Attending: Advanced Practice Midwife | Admitting: Advanced Practice Midwife

## 2019-04-19 VITALS — BP 118/78 | HR 83 | Ht 65.0 in | Wt 150.0 lb

## 2019-04-19 DIAGNOSIS — Z01419 Encounter for gynecological examination (general) (routine) without abnormal findings: Secondary | ICD-10-CM | POA: Diagnosis not present

## 2019-04-19 DIAGNOSIS — Z124 Encounter for screening for malignant neoplasm of cervix: Secondary | ICD-10-CM | POA: Diagnosis not present

## 2019-04-19 DIAGNOSIS — Z1239 Encounter for other screening for malignant neoplasm of breast: Secondary | ICD-10-CM

## 2019-04-19 NOTE — Progress Notes (Signed)
Gynecology Annual Exam  PCP: Duanne LimerickJones, Deanna C, MD  Chief Complaint:  Chief Complaint  Patient presents with  . Gynecologic Exam    Painful intercourse, vaginal dryness    History of Present Illness:Patient is a 58 y.o. No obstetric history on file. presents for annual exam. The patient has complaint today of pain with intercourse. She admits dryness. We discussed both hormonal and non-hormonal options. She has an ongoing fungal rash (no symptoms currently) and uses nystatin cream with good success. We discussed both anti-yeast and anti-inflammatory diets for possible auto-immune reaction. Her sister has a similar- but worse- rash that her provider has called an auto-immune response. She denies any other gyn concerns.   LMP: No LMP recorded. Patient is postmenopausal.  Postcoital Bleeding: no Dysmenorrhea: not applicable  The patient is sexually active. She admits to dyspareunia.  The patient does perform self breast exams.  There is no notable family history of breast or ovarian cancer in her family.  The patient wears seatbelts: Yes  The patient has regular exercise: she gets most of her exercise doing regular yard work. She admits eating a lot of meat and drinking soda daily. She eats some fruits and vegetables. She admits that her problem with GERD interferes with her sleep.    The patient denies current symptoms of depression.     Review of Systems: Review of Systems  Constitutional: Positive for malaise/fatigue.  HENT: Negative.   Eyes: Negative.   Respiratory: Negative.   Cardiovascular: Negative.   Gastrointestinal: Negative.   Genitourinary:       Pain with intercourse  Musculoskeletal: Negative.   Skin: Positive for rash.  Neurological: Negative.   Endo/Heme/Allergies: Negative.   Psychiatric/Behavioral: Negative.     Past Medical History:  Past Medical History:  Diagnosis Date  . Allergy   . Depression   . GERD (gastroesophageal reflux disease)   .  Hemorrhoid     Past Surgical History:  Past Surgical History:  Procedure Laterality Date  . COLONOSCOPY  2015   polyps/ repeat in 2 yrs- Dr Servando SnareWohl  . TONSILLECTOMY      Gynecologic History:  No LMP recorded. Patient is postmenopausal. Last Pap: 5 years ago Results were: no abnormalities  Last mammogram: 5 years ago Results were: BI-RAD I  Obstetric History: No obstetric history on file.  Family History:  Family History  Problem Relation Age of Onset  . Heart disease Father   . Cancer Paternal Aunt   . Stroke Maternal Grandmother     Social History:  Social History   Socioeconomic History  . Marital status: Single    Spouse name: Not on file  . Number of children: Not on file  . Years of education: Not on file  . Highest education level: Not on file  Occupational History  . Not on file  Social Needs  . Financial resource strain: Not on file  . Food insecurity    Worry: Not on file    Inability: Not on file  . Transportation needs    Medical: Not on file    Non-medical: Not on file  Tobacco Use  . Smoking status: Never Smoker  . Smokeless tobacco: Never Used  Substance and Sexual Activity  . Alcohol use: Yes    Alcohol/week: 0.0 standard drinks  . Drug use: No  . Sexual activity: Yes  Lifestyle  . Physical activity    Days per week: Not on file    Minutes per session: Not  on file  . Stress: Not on file  Relationships  . Social Herbalist on phone: Not on file    Gets together: Not on file    Attends religious service: Not on file    Active member of club or organization: Not on file    Attends meetings of clubs or organizations: Not on file    Relationship status: Not on file  . Intimate partner violence    Fear of current or ex partner: Not on file    Emotionally abused: Not on file    Physically abused: Not on file    Forced sexual activity: Not on file  Other Topics Concern  . Not on file  Social History Narrative  . Not on file     Allergies:  Allergies  Allergen Reactions  . Penicillins     Medications: Prior to Admission medications   Medication Sig Start Date End Date Taking? Authorizing Provider  cetirizine-pseudoephedrine (ZYRTEC-D) 5-120 MG tablet Take 1 tablet by mouth 2 (two) times daily. 10/19/18  Yes Juline Patch, MD  citalopram (CELEXA) 40 MG tablet Take 1 tablet (40 mg total) by mouth daily. 04/10/19  Yes Juline Patch, MD  desoximetasone (TOPICORT) 0.25 % cream Apply bid prn 04/10/19  Yes Juline Patch, MD  hydrocortisone (ANUSOL-HC) 2.5 % rectal cream Place 1 application rectally 2 (two) times daily. PRN   Yes [provider]  meclizine (ANTIVERT) 25 MG tablet Take 25 mg by mouth 3 (three) times daily as needed for dizziness.   Yes [provider]  montelukast (SINGULAIR) 10 MG tablet TAKE 1 TABLET(10 MG) BY MOUTH AT BEDTIME 04/10/19  Yes Juline Patch, MD  nystatin-triamcinolone (MYCOLOG II) cream APPLY EXTERNALLY TO THE AFFECTED AREA TWICE DAILY AS NEEDED 07/19/18  Yes Juline Patch, MD  pantoprazole (PROTONIX) 40 MG tablet TAKE 1 TABLET(40 MG) BY MOUTH DAILY 04/10/19  Yes Juline Patch, MD    Physical Exam Vitals: Blood pressure 118/78, pulse 83, height 5\' 5"  (1.651 m), weight 150 lb (68 kg).  General: NAD HEENT: normocephalic, anicteric Thyroid: no enlargement, no palpable nodules Pulmonary: No increased work of breathing, CTAB Cardiovascular: RRR, distal pulses 2+ Breast: Breast symmetrical, no tenderness, no palpable nodules or masses, no skin or nipple retraction present, no nipple discharge.  No axillary or supraclavicular lymphadenopathy. Abdomen: NABS, soft, non-tender, non-distended.  Umbilicus without lesions.  No hepatomegaly, splenomegaly or masses palpable. No evidence of hernia  Genitourinary:  External: Normal external female genitalia.  Normal urethral meatus, normal Bartholin's and Skene's glands.    Vagina: Normal vaginal mucosa, no evidence of prolapse.     Cervix: Grossly normal in appearance, no bleeding  Uterus: Non-enlarged, mobile, normal contour.  No CMT  Adnexa: ovaries non-enlarged, no adnexal masses  Rectal: deferred  Lymphatic: no evidence of inguinal lymphadenopathy Extremities: no edema, erythema, or tenderness Neurologic: Grossly intact Psychiatric: mood appropriate, affect full  Female chaperone present for pelvic and breast  portions of the physical exam     Assessment: 58 y.o. No obstetric history on file. routine annual exam  Plan: Problem List Items Addressed This Visit    None    Visit Diagnoses    Well woman exam with routine gynecological exam    -  Primary   Relevant Orders   Cytology - PAP   Mammogram Screening Routine   Cervical cancer screening       Relevant Orders   Cytology - PAP  Encounter for screening for malignant neoplasm of breast, unspecified screening modality       Relevant Orders   Mammogram Screening Routine      1) Mammogram - recommend yearly screening mammogram.  Mammogram Was ordered today  2) STI screening  was offered and declined  3) ASCCP guidelines and rationale discussed.  Patient opts for every 5 years screening interval  4) Osteoporosis  - per USPTF routine screening DEXA at age 67   Consider FDA-approved medical therapies in postmenopausal women and men aged 53 years and older, based on the following: a) A hip or vertebral (clinical or morphometric) fracture b) T-score ? -2.5 at the femoral neck or spine after appropriate evaluation to exclude secondary causes C) Low bone mass (T-score between -1.0 and -2.5 at the femoral neck or spine) and a 10-year probability of a hip fracture ? 3% or a 10-year probability of a major osteoporosis-related fracture ? 20% based on the [place]-adapted WHO algorithm   5) Routine healthcare maintenance including cholesterol, diabetes screening discussed Declines  6) Colonoscopy: next due in 2025.  Screening recommended starting at age 2  for average risk individuals, age 21 for individuals deemed at increased risk (including African Americans) and recommended to continue until age 58.  For patient age 40-85 individualized approach is recommended.  Gold standard screening is via colonoscopy, Cologuard screening is an acceptable alternative for patient unwilling or unable to undergo colonoscopy.  "Colorectal cancer screening for average?risk adults: 2018 guideline update from the American Cancer Society"[place]: A Cancer Journal for Clinicians: Nov 03, 2016   7) Vaginal dryness: try coconut oil for lubrication, hyaluronic acid for rejuvenation of vaginal tissue. Patient will let me know if she is interested in [place] or other hormonal option.  8) Skin Rash: increase anti-inflammatory and anti-yeast lifestyle including Omega 3 fatty acid source  9) Return in about 1 year (around 04/18/2020) for annual established gyn.    Tresea Mall, CNM [facility] 04/19/2019, 10:46 AM

## 2019-04-19 NOTE — Patient Instructions (Addendum)
Fish Oil, Omega-3 Fatty Acids capsules (OTC) What is this medicine? FISH OIL, OMEGA-3 FATTY ACIDS (Fish Oil, oh MAY ga - 3 fatty AS ids) are essential fats. It is promoted to help support a healthy heart. This dietary supplement is used to add to a healthy diet. The FDA has not approved this supplement for any medical use. This supplement may be used for other purposes; ask your health care provider or pharmacist if you have questions. This medicine may be used for other purposes; ask your health care provider or pharmacist if you have questions. COMMON BRAND NAME(S): Omega-3, OMEGA-3 IQ DHA, TherOmega, THEROMEGA SPORT What should I tell my health care provider before I take this medicine? They need to know if you have any of these conditions  bleeding problems  lung or breathing disease, like asthma  an unusual or allergic reaction to fish oil, omega-3 fatty acids, fish, other medicines, foods, dyes, or preservatives  pregnant or trying to get pregnant  breast-feeding How should I use this medicine? Take this medicine by mouth with a glass of water. Follow the directions on the package or prescription label. Take with food. Take your medicine at regular intervals. Do not take your medicine more often than directed. Talk to your pediatrician regarding the use of this medicine in children. Special care may be needed. This medicine should not be used in children without a doctor's advice. Overdosage: If you think you have taken too much of this medicine contact a poison control center or emergency room at once. NOTE: This medicine is only for you. Do not share this medicine with others. What if I miss a dose? If you miss a dose, take it as soon as you can. If it is almost time for your next dose, take only that dose. Do not take double or extra doses. What may interact with this medicine?  aspirin and aspirin-like medicines  herbal products like danshen, dong quai, garlic pills, ginger,  ginkgo biloba, horse chestnut, willow bark, and others  medicines that treat or prevent blood clots like enoxaparin, heparin, warfarin This list may not describe all possible interactions. Give your health care provider a list of all the medicines, herbs, non-prescription drugs, or dietary supplements you use. Also tell them if you smoke, drink alcohol, or use illegal drugs. Some items may interact with your medicine. What should I watch for while using this medicine? Follow a good diet and exercise plan. Taking a dietary supplement does not replace a healthy lifestyle. Some foods that have omega-3 fatty acids naturally are fatty fish like albacore tuna, halibut, herring, mackerel, lake trout, salmon, and sardines. Too much of this supplement can be unsafe. Talk to your doctor or health care provider about how much of this supplement is right for you. If you are scheduled for any medical or dental procedure, tell your healthcare provider that you are taking this medicine. You may need to stop taking this medicine before the procedure. Herbal or dietary supplements are not regulated like medicines. Rigid quality control standards are not required for dietary supplements. The purity and strength of these products can vary. The safety and effect of this dietary supplement for a certain disease or illness is not well known. This product is not intended to diagnose, treat, cure or prevent any disease. The Food and Drug Administration suggests the following to help consumers protect themselves:  Always read product labels and follow directions.  Natural does not mean a product is safe for humans  to take.  Look for products that include USP after the ingredient name. This means that the manufacturer followed the standards of the [place] Pharmacopoeia.  Supplements made or sold by a nationally known food or drug company are more likely to be made under tight controls. You can write to the company for more  information about how the product was made. What side effects may I notice from receiving this medicine? Side effects that you should report to your doctor or health care professional as soon as possible:  allergic reactions like skin rash, itching or hives, swelling of the face, lips, or tongue  breathing problems  changes in your moods or emotions  unusual bleeding or bruising Side effects that usually do not require medical attention (report to your doctor or health care professional if they continue or are bothersome):  bad or fishy breath  belching  diarrhea  nausea  stomach gas, upset  weight gain This list may not describe all possible side effects. Call your doctor for medical advice about side effects. You may report side effects to FDA at 1-800-FDA-1088. Where should I keep my medicine? Keep out of the reach of children. Store at room temperature or as directed on the package label. Protect from moisture. Do not freeze. Throw away any unused medicine after the expiration date. NOTE: This sheet is a summary. It may not cover all possible information. If you have questions about this medicine, talk to your doctor, pharmacist, or health care provider.  2020 Elsevier/Gold Standard (2014-09-12 09:36:32)  Aloe Vera, Collagen, Hyaluronic Acid, Lidocaine Topical gel What is this medicine? ALOE VERA, COLLAGEN, HYALURONIC ACID, LIDOCAINE (AL oh VER uh, KOL uh juhn, hahy uh loo ron ik AS id, LYE doe kane) is a wound care gel. It is used to moisturize, heal, prevent, and treat the pain of a wound. This medicine may be used for other purposes; ask your health care provider or pharmacist if you have questions. COMMON BRAND NAME(S): Regenecare HA What should I tell my health care provider before I take this medicine? They need to know if you have any of these conditions:  heart problems  infected, open or damaged skin  an unusual or allergic reaction to lidocaine, other  medicines, foods, dyes, or preservatives  pregnant or trying to get pregnant  breast-feeding How should I use this medicine? This medicine is for use on the skin. Follow the directions on the package or prescription label. Apply your medicine at regular intervals. Do not use it more often than directed. Do not stop using it except on your doctor's advice. Talk to your pediatrician regarding the use of this medicine in children. Special care may be needed. Overdosage: If you think you have taken too much of this medicine contact a poison control center or emergency room at once. NOTE: This medicine is only for you. Do not share this medicine with others. What if I miss a dose? If you miss a dose, use it as soon as you can. If it is almost time for your next dose, use only that dose. Do not use double or extra doses. What may interact with this medicine? Interactions are not expected. This list may not describe all possible interactions. Give your health care provider a list of all the medicines, herbs, non-prescription drugs, or dietary supplements you use. Also tell them if you smoke, drink alcohol, or use illegal drugs. Some items may interact with your medicine. What should I watch for while using  this medicine? Visit your doctor for regular check-ups. Tell your doctor or healthcare professional if your symptoms do not start to get better or if they get worse. Be careful to avoid injury while the area is numb and you are not aware of pain. What side effects may I notice from receiving this medicine? Side effects that you should report to your doctor or health care professional as soon as possible:  allergic reactions like skin rash, itching or hives, swelling of the face, lips, or tongue  breathing problems  changes in vision  chills, fever  confused, excitable, nervous, restless  dizzy, drowsy  headache  irregular heartbeat  nausea, vomiting  seizures  tremors Side  effects that usually do not require medical attention (report to your doctor or health care professional if they continue or are bothersome):  numb area This list may not describe all possible side effects. Call your doctor for medical advice about side effects. You may report side effects to FDA at 1-800-FDA-1088. Where should I keep my medicine? Keep out of reach of children. Store at room temperature between 15 and 30 degrees C (59 and 86 degrees F). Throw away any unused medicine after the expiration date. NOTE: This sheet is a summary. It may not cover all possible information. If you have questions about this medicine, talk to your doctor, pharmacist, or health care provider.  2020 Elsevier/Gold Standard (2007-08-10 11:15:57)  Mediterranean Diet A Mediterranean diet refers to food and lifestyle choices that are based on the traditions of countries located on the [employer] [place]. This way of eating has been shown to help prevent certain conditions and improve outcomes for people who have chronic diseases, like kidney disease and heart disease. What are tips for following this plan? Lifestyle  Cook and eat meals together with your family, when possible.  Drink enough fluid to keep your urine clear or pale yellow.  Be physically active every day. This includes: ? Aerobic exercise like running or swimming. ? Leisure activities like gardening, walking, or housework.  Get 7-8 hours of sleep each night.  If recommended by your health care provider, drink red wine in moderation. This means 1 glass a day for nonpregnant women and 2 glasses a day for men. A glass of wine equals 5 oz (150 mL). Reading food labels   Check the serving size of packaged foods. For foods such as rice and pasta, the serving size refers to the amount of cooked product, not dry.  Check the total fat in packaged foods. Avoid foods that have saturated fat or trans fats.  Check the ingredients list for added  sugars, such as corn syrup. Shopping  At the grocery store, buy most of your food from the areas near the walls of the store. This includes: ? Fresh fruits and vegetables (produce). ? Grains, beans, nuts, and seeds. Some of these may be available in unpackaged forms or large amounts (in bulk). ? Fresh seafood. ? Poultry and eggs. ? Low-fat dairy products.  Buy whole ingredients instead of prepackaged foods.  Buy fresh fruits and vegetables in-season from local farmers markets.  Buy frozen fruits and vegetables in resealable bags.  If you do not have access to quality fresh seafood, buy precooked frozen shrimp or canned fish, such as tuna, salmon, or sardines.  Buy small amounts of raw or cooked vegetables, salads, or olives from the deli or salad bar at your store.  Stock your pantry so you always have certain foods on hand,  such as olive oil, canned tuna, canned tomatoes, rice, pasta, and beans. Cooking  Cook foods with extra-virgin olive oil instead of using butter or other vegetable oils.  Have meat as a side dish, and have vegetables or grains as your main dish. This means having meat in small portions or adding small amounts of meat to foods like pasta or stew.  Use beans or vegetables instead of meat in common dishes like chili or lasagna.  Experiment with different cooking methods. Try roasting or broiling vegetables instead of steaming or sauteing them.  Add frozen vegetables to soups, stews, pasta, or rice.  Add nuts or seeds for added healthy fat at each meal. You can add these to yogurt, salads, or vegetable dishes.  Marinate fish or vegetables using olive oil, lemon juice, garlic, and fresh herbs. Meal planning   Plan to eat 1 vegetarian meal one day each week. Try to work up to 2 vegetarian meals, if possible.  Eat seafood 2 or more times a week.  Have healthy snacks readily available, such as: ? Vegetable sticks with hummus. ? [place] yogurt. ? Fruit and nut  trail mix.  Eat balanced meals throughout the week. This includes: ? Fruit: 2-3 servings a day ? Vegetables: 4-5 servings a day ? Low-fat dairy: 2 servings a day ? Fish, poultry, or lean meat: 1 serving a day ? Beans and legumes: 2 or more servings a week ? Nuts and seeds: 1-2 servings a day ? Whole grains: 6-8 servings a day ? Extra-virgin olive oil: 3-4 servings a day  Limit red meat and sweets to only a few servings a month What are my food choices?  Mediterranean diet ? Recommended  Grains: Whole-grain pasta. Brown rice. Bulgar wheat. Polenta. Couscous. Whole-wheat bread. Orpah Cobb.  Vegetables: Artichokes. Beets. Broccoli. Cabbage. Carrots. Eggplant. Green beans. Chard. Kale. Spinach. Onions. Leeks. Peas. Squash. Tomatoes. Peppers. Radishes.  Fruits: Apples. Apricots. Avocado. Berries. Bananas. Cherries. Dates. Figs. Grapes. Lemons. Melon. Oranges. Peaches. Plums. Pomegranate.  Meats and other protein foods: Beans. Almonds. Sunflower seeds. Pine nuts. Peanuts. Cod. Salmon. Scallops. Shrimp. Tuna. Tilapia. Clams. Oysters. Eggs.  Dairy: Low-fat milk. Cheese. Greek yogurt.  Beverages: Water. Red wine. Herbal tea.  Fats and oils: Extra virgin olive oil. Avocado oil. Grape seed oil.  Sweets and desserts: [place] yogurt with honey. Baked apples. Poached pears. Trail mix.  Seasoning and other foods: Basil. Cilantro. Coriander. Cumin. Mint. Parsley. Sage. Rosemary. Tarragon. Garlic. Oregano. Thyme. Pepper. Balsalmic vinegar. Tahini. Hummus. Tomato sauce. Olives. Mushrooms. ? Limit these  Grains: Prepackaged pasta or rice dishes. Prepackaged cereal with added sugar.  Vegetables: Deep fried potatoes (french fries).  Fruits: Fruit canned in syrup.  Meats and other protein foods: Beef. Pork. Lamb. Poultry with skin. Hot dogs. Tomasa Blase.  Dairy: Ice cream. Sour cream. Whole milk.  Beverages: Juice. Sugar-sweetened soft drinks. Beer. Liquor and spirits.  Fats and oils: Butter.  Canola oil. Vegetable oil. Beef fat (tallow). Lard.  Sweets and desserts: Cookies. Cakes. Pies. Candy.  Seasoning and other foods: Mayonnaise. Premade sauces and marinades. The items listed may not be a complete list. Talk with your dietitian about what dietary choices are right for you. Summary  The Mediterranean diet includes both food and lifestyle choices.  Eat a variety of fresh fruits and vegetables, beans, nuts, seeds, and whole grains.  Limit the amount of red meat and sweets that you eat.  Talk with your health care provider about whether it is safe for you to drink red  wine in moderation. This means 1 glass a day for nonpregnant women and 2 glasses a day for men. A glass of wine equals 5 oz (150 mL). This information is not intended to replace advice given to you by your health care provider. Make sure you discuss any questions you have with your health care provider. Document Released: 01/15/2016 Document Revised: 01/22/2016 Document Reviewed: 01/15/2016 Elsevier Patient Education  2020 [place] Maintenance, Female Adopting a healthy lifestyle and getting preventive care are important in promoting health and wellness. Ask your health care provider about:  The right schedule for you to have regular tests and exams.  Things you can do on your own to prevent diseases and keep yourself healthy. What should I know about diet, weight, and exercise? Eat a healthy diet   Eat a diet that includes plenty of vegetables, fruits, low-fat dairy products, and lean protein.  Do not eat a lot of foods that are high in solid fats, added sugars, or sodium. Maintain a healthy weight Body mass index (BMI) is used to identify weight problems. It estimates body fat based on height and weight. Your health care provider can help determine your BMI and help you achieve or maintain a healthy weight. Get regular exercise Get regular exercise. This is one of the most important things  you can do for your health. Most adults should:  Exercise for at least 150 minutes each week. The exercise should increase your heart rate and make you sweat (moderate-intensity exercise).  Do strengthening exercises at least twice a week. This is in addition to the moderate-intensity exercise.  Spend less time sitting. Even light physical activity can be beneficial. Watch cholesterol and blood lipids Have your blood tested for lipids and cholesterol at 56 years of age, then have this test every 5 years. Have your cholesterol levels checked more often if:  Your lipid or cholesterol levels are high.  You are older than 56 years of age.  You are at high risk for heart disease. What should I know about cancer screening? Depending on your health history and family history, you may need to have cancer screening at various ages. This may include screening for:  Breast cancer.  Cervical cancer.  Colorectal cancer.  Skin cancer.  Lung cancer. What should I know about heart disease, diabetes, and high blood pressure? Blood pressure and heart disease  High blood pressure causes heart disease and increases the risk of stroke. This is more likely to develop in people who have high blood pressure readings, are of African descent, or are overweight.  Have your blood pressure checked: ? Every 3-5 years if you are 83-44 years of age. ? Every year if you are 31 years old or older. Diabetes Have regular diabetes screenings. This checks your fasting blood sugar level. Have the screening done:  Once every three years after age 68 if you are at a normal weight and have a low risk for diabetes.  More often and at a younger age if you are overweight or have a high risk for diabetes. What should I know about preventing infection? Hepatitis B If you have a higher risk for hepatitis B, you should be screened for this virus. Talk with your health care provider to find out if you are at risk for  hepatitis B infection. Hepatitis C Testing is recommended for:  Everyone born from [birth year] through 1963.  Anyone with known risk factors for hepatitis C. Sexually transmitted infections (STIs)  Get screened for STIs, including gonorrhea and chlamydia, if: ? You are sexually active and are younger than 56 years of age. ? You are older than 56 years of age and your health care provider tells you that you are at risk for this type of infection. ? Your sexual activity has changed since you were last screened, and you are at increased risk for chlamydia or gonorrhea. Ask your health care provider if you are at risk.  Ask your health care provider about whether you are at high risk for HIV. Your health care provider may recommend a prescription medicine to help prevent HIV infection. If you choose to take medicine to prevent HIV, you should first get tested for HIV. You should then be tested every 3 months for as long as you are taking the medicine. Pregnancy  If you are about to stop having your period (premenopausal) and you may become pregnant, seek counseling before you get pregnant.  Take 400 to 800 micrograms (mcg) of folic acid every day if you become pregnant.  Ask for birth control (contraception) if you want to prevent pregnancy. Osteoporosis and menopause Osteoporosis is a disease in which the bones lose minerals and strength with aging. This can result in bone fractures. If you are 56 years old or older, or if you are at risk for osteoporosis and fractures, ask your health care provider if you should:  Be screened for bone loss.  Take a calcium or vitamin D supplement to lower your risk of fractures.  Be given hormone replacement therapy (HRT) to treat symptoms of menopause. Follow these instructions at home: Lifestyle  Do not use any products that contain nicotine or tobacco, such as cigarettes, e-cigarettes, and chewing tobacco. If you need help quitting, ask your health care  provider.  Do not use street drugs.  Do not share needles.  Ask your health care provider for help if you need support or information about quitting drugs. Alcohol use  Do not drink alcohol if: ? Your health care provider tells you not to drink. ? You are pregnant, may be pregnant, or are planning to become pregnant.  If you drink alcohol: ? Limit how much you use to 0-1 drink a day. ? Limit intake if you are breastfeeding.  Be aware of how much alcohol is in your drink. In the [place] one drink equals one 12 oz bottle of beer (355 mL), one 5 oz glass of wine (148 mL), or one 1 oz glass of hard liquor (44 mL). General instructions  Schedule regular health, dental, and eye exams.  Stay current with your vaccines.  Tell your health care provider if: ? You often feel depressed. ? You have ever been abused or do not feel safe at home. Summary  Adopting a healthy lifestyle and getting preventive care are important in promoting health and wellness.  Follow your health care provider's instructions about healthy diet, exercising, and getting tested or screened for diseases.  Follow your health care provider's instructions on monitoring your cholesterol and blood pressure. This information is not intended to replace advice given to you by your health care provider. Make sure you discuss any questions you have with your health care provider. Document Released: 12/07/2010 Document Revised: 05/17/2018 Document Reviewed: 05/17/2018 Elsevier Patient Education  2020 [employer]

## 2019-04-20 LAB — CYTOLOGY - PAP
Comment: NEGATIVE
Diagnosis: NEGATIVE
High risk HPV: NEGATIVE

## 2019-07-12 ENCOUNTER — Other Ambulatory Visit: Payer: Self-pay

## 2019-07-12 ENCOUNTER — Ambulatory Visit (INDEPENDENT_AMBULATORY_CARE_PROVIDER_SITE_OTHER): Payer: BC Managed Care – PPO

## 2019-07-12 DIAGNOSIS — Z23 Encounter for immunization: Secondary | ICD-10-CM | POA: Diagnosis not present

## 2020-01-10 ENCOUNTER — Other Ambulatory Visit: Payer: Self-pay | Admitting: Family Medicine

## 2020-01-10 DIAGNOSIS — F324 Major depressive disorder, single episode, in partial remission: Secondary | ICD-10-CM

## 2020-02-13 ENCOUNTER — Other Ambulatory Visit: Payer: Self-pay | Admitting: Family Medicine

## 2020-02-13 DIAGNOSIS — F324 Major depressive disorder, single episode, in partial remission: Secondary | ICD-10-CM

## 2020-02-13 NOTE — Telephone Encounter (Signed)
Requested medications are due for refill today? Yes   Requested medications are on active medication list?  Yes  Last Refill:   01/10/2020  # 30 with no refills.  Appears to be a courtesy refill.    Future visit scheduled?  No   Notes to Clinic:  Medication failed Rx refill protocol due to no valid encounter in the last 6 months.  Last office visit was 10 months ago.  Courtesy Refill has already been provided.

## 2020-02-21 ENCOUNTER — Other Ambulatory Visit: Payer: Self-pay

## 2020-02-21 ENCOUNTER — Encounter: Payer: Self-pay | Admitting: Family Medicine

## 2020-02-21 ENCOUNTER — Ambulatory Visit (INDEPENDENT_AMBULATORY_CARE_PROVIDER_SITE_OTHER): Payer: BC Managed Care – PPO | Admitting: Family Medicine

## 2020-02-21 DIAGNOSIS — L259 Unspecified contact dermatitis, unspecified cause: Secondary | ICD-10-CM | POA: Diagnosis not present

## 2020-02-21 DIAGNOSIS — B354 Tinea corporis: Secondary | ICD-10-CM | POA: Diagnosis not present

## 2020-02-21 DIAGNOSIS — F324 Major depressive disorder, single episode, in partial remission: Secondary | ICD-10-CM

## 2020-02-21 DIAGNOSIS — K219 Gastro-esophageal reflux disease without esophagitis: Secondary | ICD-10-CM

## 2020-02-21 DIAGNOSIS — J301 Allergic rhinitis due to pollen: Secondary | ICD-10-CM | POA: Diagnosis not present

## 2020-02-21 DIAGNOSIS — L2084 Intrinsic (allergic) eczema: Secondary | ICD-10-CM

## 2020-02-21 MED ORDER — MONTELUKAST SODIUM 10 MG PO TABS: ORAL_TABLET | ORAL | 1 refills | Status: DC

## 2020-02-21 MED ORDER — DESOXIMETASONE 0.25 % EX CREA: TOPICAL_CREAM | CUTANEOUS | 1 refills | Status: AC

## 2020-02-21 MED ORDER — NYSTATIN-TRIAMCINOLONE 100000-0.1 UNIT/GM-% EX CREA: TOPICAL_CREAM | Freq: Two times a day (BID) | CUTANEOUS | 1 refills | Status: DC

## 2020-02-21 MED ORDER — CETIRIZINE-PSEUDOEPHEDRINE ER 5-120 MG PO TB12: 1.0000 | ORAL_TABLET | Freq: Two times a day (BID) | ORAL | 1 refills | Status: DC

## 2020-02-21 MED ORDER — CITALOPRAM HYDROBROMIDE 40 MG PO TABS: 40.0000 mg | ORAL_TABLET | Freq: Every day | ORAL | 1 refills | Status: DC

## 2020-02-21 MED ORDER — PANTOPRAZOLE SODIUM 40 MG PO TBEC: DELAYED_RELEASE_TABLET | ORAL | 1 refills | Status: DC

## 2020-02-21 NOTE — Progress Notes (Signed)
Date:  02/21/2020   Name:  Debbie Bell   DOB:  [birth year]   MRN:  [id number]   Chief Complaint: Gastroesophageal Reflux, Allergic Rhinitis , Asthma, and Depression  Gastroesophageal Reflux She reports no abdominal pain, no belching, no choking, no coughing, no dysphagia, no early satiety, no globus sensation, no nausea, no sore throat, no stridor, no tooth decay, no water brash or no wheezing. This is a chronic problem. The current episode started more than 1 year ago. The problem has been waxing and waning. The symptoms are aggravated by certain foods and ETOH. Pertinent negatives include no anemia, fatigue, melena or muscle weakness. She has tried a PPI for the symptoms. The treatment provided moderate relief.  Depression        This is a chronic problem.  The current episode started more than 1 year ago.   The onset quality is gradual.   The problem has been gradually improving since onset.  Associated symptoms include insomnia.  Associated symptoms include no decreased concentration, no fatigue, no helplessness, no hopelessness, not irritable, no restlessness, no appetite change, no body aches, no myalgias, no headaches, no indigestion and not sad.  Past treatments include SSRIs - Selective serotonin reuptake inhibitors.  Compliance with treatment is good.  Previous treatment provided mild relief. URI  This is a chronic (seasonal flares) problem. The current episode started more than 1 year ago. The problem has been waxing and waning. There has been no fever. Pertinent negatives include no abdominal pain, congestion, coughing, diarrhea, dysuria, ear pain, headaches, nausea, rash, rhinorrhea, sneezing, sore throat, vomiting or wheezing.    Lab Results  Component Value Date   CREATININE 0.79 04/10/2019   BUN 14 04/10/2019   NA 138 04/10/2019   K 4.3 04/10/2019   CL 99 04/10/2019   CO2 26 04/10/2019   Lab Results  Component Value Date   CHOL 213 (H) 10/23/2015   HDL 71 10/23/2015    LDLCALC 124 (H) 10/23/2015   TRIG 91 10/23/2015   CHOLHDL 3.0 10/23/2015   Lab Results  Component Value Date   TSH 0.641 10/19/2018   No results found for: HGBA1C Lab Results  Component Value Date   WBC 6.4 10/19/2018   HGB 14.4 10/19/2018   HCT 41.2 10/19/2018   MCV 90 10/19/2018   PLT 383 10/19/2018   Lab Results  Component Value Date   ALT 14 04/10/2019   AST 21 04/10/2019   ALKPHOS 96 04/10/2019   BILITOT 0.4 04/10/2019     Review of Systems  Constitutional: Negative.  Negative for appetite change, chills, fatigue, fever and unexpected weight change.  HENT: Negative for congestion, ear discharge, ear pain, rhinorrhea, sinus pressure, sneezing and sore throat.   Eyes: Negative for photophobia, pain, discharge, redness and itching.  Respiratory: Negative for cough, choking, shortness of breath, wheezing and stridor.   Gastrointestinal: Negative for abdominal pain, blood in stool, constipation, diarrhea, dysphagia, melena, nausea and vomiting.  Endocrine: Negative for cold intolerance, heat intolerance, polydipsia, polyphagia and polyuria.  Genitourinary: Negative for dysuria, flank pain, frequency, hematuria, menstrual problem, pelvic pain, urgency, vaginal bleeding and vaginal discharge.  Musculoskeletal: Negative for arthralgias, back pain, myalgias and muscle weakness.  Skin: Negative for rash.  Allergic/Immunologic: Negative for environmental allergies and food allergies.  Neurological: Negative for dizziness, weakness, light-headedness, numbness and headaches.  Hematological: Negative for adenopathy. Does not bruise/bleed easily.  Psychiatric/Behavioral: Positive for depression. Negative for decreased concentration and dysphoric mood. The patient has insomnia.  The patient is not nervous/anxious.     Patient Active Problem List   Diagnosis Date Noted  . Persistent cough 04/10/2019    Allergies  Allergen Reactions  . Penicillins     Past Surgical History:    Procedure Laterality Date  . COLONOSCOPY  2015   polyps/ repeat in 2 yrs- Dr Servando Snare  . TONSILLECTOMY      Social History   Tobacco Use  . Smoking status: Never Smoker  . Smokeless tobacco: Never Used  Vaping Use  . Vaping Use: Never used  Substance Use Topics  . Alcohol use: Yes    Alcohol/week: 0.0 standard drinks  . Drug use: No     Medication list has been reviewed and updated.  Current Meds  Medication Sig  . cetirizine-pseudoephedrine (ZYRTEC-D) 5-120 MG tablet Take 1 tablet by mouth 2 (two) times daily.  . citalopram (CELEXA) 40 MG tablet TAKE 1 TABLET BY MOUTH DAILY  . desoximetasone (TOPICORT) 0.25 % cream Apply bid prn  . hydrocortisone (ANUSOL-HC) 2.5 % rectal cream Place 1 application rectally 2 (two) times daily. PRN  . meclizine (ANTIVERT) 25 MG tablet Take 25 mg by mouth 3 (three) times daily as needed for dizziness.  . montelukast (SINGULAIR) 10 MG tablet TAKE 1 TABLET(10 MG) BY MOUTH AT BEDTIME  . nystatin-triamcinolone (MYCOLOG II) cream APPLY EXTERNALLY TO THE AFFECTED AREA TWICE DAILY AS NEEDED  . pantoprazole (PROTONIX) 40 MG tablet TAKE 1 TABLET(40 MG) BY MOUTH DAILY    PHQ 2/9 Scores 02/21/2020 04/10/2019 10/19/2018 04/07/2018  PHQ - 2 Score 0 0 0 1  PHQ- 9 Score 1 2 0 4    GAD 7 : Generalized Anxiety Score 02/21/2020  Nervous, Anxious, on Edge 0  Control/stop worrying 0  Worry too much - different things 0  Trouble relaxing 0  Restless 0  Easily annoyed or irritable 0  Afraid - awful might happen 0  Total GAD 7 Score 0    BP Readings from Last 3 Encounters:  02/21/20 138/64  04/19/19 118/78  04/10/19 120/80    Physical Exam Vitals and nursing note reviewed.  Constitutional:      General: She is not irritable.    Appearance: She is well-developed.  HENT:     Head: Normocephalic.     Right Ear: External ear normal.     Left Ear: External ear normal.  Eyes:     General: Lids are everted, no foreign bodies appreciated. No scleral  icterus.       Left eye: No foreign body or hordeolum.     Conjunctiva/sclera: Conjunctivae normal.     Right eye: Right conjunctiva is not injected.     Left eye: Left conjunctiva is not injected.     Pupils: Pupils are equal, round, and reactive to light.  Neck:     Thyroid: No thyromegaly.     Vascular: No JVD.     Trachea: No tracheal deviation.  Cardiovascular:     Rate and Rhythm: Normal rate and regular rhythm.     Heart sounds: Normal heart sounds. No murmur heard.  No friction rub. No gallop.   Pulmonary:     Effort: Pulmonary effort is normal. No respiratory distress.     Breath sounds: Normal breath sounds. No wheezing or rales.  Abdominal:     General: Bowel sounds are normal.     Palpations: Abdomen is soft. There is no mass.     Tenderness: There is no abdominal tenderness. There is  no guarding or rebound.  Musculoskeletal:        General: No tenderness. Normal range of motion.     Cervical back: Normal range of motion and neck supple.  Lymphadenopathy:     Cervical: No cervical adenopathy.  Skin:    General: Skin is warm.     Findings: No rash.  Neurological:     Mental Status: She is alert and oriented to person, place, and time.     Cranial Nerves: No cranial nerve deficit.     Deep Tendon Reflexes: Reflexes normal.  Psychiatric:        Mood and Affect: Mood is not anxious or depressed.     Wt Readings from Last 3 Encounters:  02/21/20 152 lb (68.9 kg)  04/19/19 150 lb (68 kg)  04/10/19 141 lb (64 kg)    BP 138/64   Pulse 80   Ht 5\' 5"  (1.651 m)   Wt 152 lb (68.9 kg)   BMI 25.29 kg/m   Assessment and Plan: 1. Major depressive disorder in partial remission, unspecified whether recurrent (HCC) Chronic.  Controlled.  Stable.  PHQ score 0.  Continue Celexa 40 mg once a day - citalopram (CELEXA) 40 MG tablet; Take 1 tablet (40 mg total) by mouth daily.  Dispense: 90 tablet; Refill: 1  2. Seasonal allergic rhinitis due to pollen Chronic.   Controlled.  Stable.  Continue Zyrtec-D and Singulair 10 mg once a day. - cetirizine-pseudoephedrine (ZYRTEC-D) 5-120 MG tablet; Take 1 tablet by mouth 2 (two) times daily.  Dispense: 180 tablet; Refill: 1 - montelukast (SINGULAIR) 10 MG tablet; TAKE 1 TABLET(10 MG) BY MOUTH AT BEDTIME  Dispense: 90 tablet; Refill: 1  3. Contact dermatitis .  Controlled.  Episodic.  Used on an as-needed basis nystatin triamcinolone cream apply twice a day. - nystatin-triamcinolone (MYCOLOG II) cream; Apply topically 2 (two) times daily.  Dispense: 30 g; Refill: 1  4. Tinea corporis Chronic.  Controlled.  Stable. - nystatin-triamcinolone (MYCOLOG II) cream; Apply topically 2 (two) times daily.  Dispense: 30 g; Refill: 1  5. Gastroesophageal reflux disease, unspecified whether esophagitis present Chronic.  Controlled.  Stable.  Continue pantoprazole 40 mg once a day. - pantoprazole (PROTONIX) 40 MG tablet; TAKE 1 TABLET(40 MG) BY MOUTH DAILY  Dispense: 90 tablet; Refill: 1  6. Intrinsic eczema Chronic.  Episodic.  Presently stable.  But access to Topicort 0.25% cream as needed as needed - desoximetasone (TOPICORT) 0.25 % cream; Apply bid prn  Dispense: 30 g; Refill: 1  Patient will return for face-to-face for suspected sleep apnea which is affecting her sleep pattern and daytime somnolence is of concern.

## 2020-02-25 ENCOUNTER — Other Ambulatory Visit: Payer: Self-pay | Admitting: Family Medicine

## 2020-02-25 DIAGNOSIS — F324 Major depressive disorder, single episode, in partial remission: Secondary | ICD-10-CM

## 2020-02-27 ENCOUNTER — Telehealth: Payer: Self-pay | Admitting: Family Medicine

## 2020-02-27 NOTE — Telephone Encounter (Unsigned)
Copied from CRM [id number]. Topic: General - Other >> Feb 27, 2020  2:48 PM Wyonia Hough E wrote: Reason for CRM: Pt called to inform Delice Bison that she still has not heard from the place about scheduling mammogram/ please advise

## 2020-02-28 ENCOUNTER — Other Ambulatory Visit: Payer: Self-pay | Admitting: Family Medicine

## 2020-02-28 DIAGNOSIS — Z1231 Encounter for screening mammogram for malignant neoplasm of breast: Secondary | ICD-10-CM

## 2020-02-28 NOTE — Telephone Encounter (Signed)
Called pt with Sept 30th @ 440 in [place] mamm[facility]

## 2020-03-06 ENCOUNTER — Ambulatory Visit
Admission: RE | Admit: 2020-03-06 | Discharge: 2020-03-06 | Disposition: A | Discharge status: Home / Self Care | Payer: BC Managed Care – PPO | Source: Ambulatory Visit | Attending: Family Medicine | Admitting: Family Medicine

## 2020-03-06 ENCOUNTER — Other Ambulatory Visit: Payer: Self-pay

## 2020-03-06 DIAGNOSIS — Z1231 Encounter for screening mammogram for malignant neoplasm of breast: Secondary | ICD-10-CM | POA: Diagnosis not present

## 2020-03-14 ENCOUNTER — Other Ambulatory Visit: Payer: Self-pay

## 2020-03-14 ENCOUNTER — Telehealth: Payer: Self-pay

## 2020-03-14 DIAGNOSIS — R42 Dizziness and giddiness: Secondary | ICD-10-CM

## 2020-03-14 MED ORDER — MECLIZINE HCL 25 MG PO TABS: 25.0000 mg | ORAL_TABLET | Freq: Three times a day (TID) | ORAL | 1 refills | Status: DC | PRN

## 2020-03-14 NOTE — Telephone Encounter (Unsigned)
Copied from CRM [id number]. Topic: General - Other >> Mar 14, 2020  8:01 AM Tamela Oddi wrote: Reason for CRM: Patient is calling to speak with Delice Bison about her vertigo and her medication.  Please advise and call patient to discuss at [phone]

## 2020-04-07 ENCOUNTER — Encounter: Payer: Self-pay | Admitting: Family Medicine

## 2020-04-07 ENCOUNTER — Ambulatory Visit (INDEPENDENT_AMBULATORY_CARE_PROVIDER_SITE_OTHER): Payer: BC Managed Care – PPO | Admitting: Family Medicine

## 2020-04-07 ENCOUNTER — Other Ambulatory Visit: Payer: Self-pay

## 2020-04-07 VITALS — BP 128/80 | HR 80 | Ht 65.0 in | Wt 153.0 lb

## 2020-04-07 DIAGNOSIS — I872 Venous insufficiency (chronic) (peripheral): Secondary | ICD-10-CM | POA: Diagnosis not present

## 2020-04-07 DIAGNOSIS — Z7189 Other specified counseling: Secondary | ICD-10-CM

## 2020-04-07 DIAGNOSIS — M25572 Pain in left ankle and joints of left foot: Secondary | ICD-10-CM | POA: Diagnosis not present

## 2020-04-07 DIAGNOSIS — Z23 Encounter for immunization: Secondary | ICD-10-CM | POA: Diagnosis not present

## 2020-04-07 DIAGNOSIS — G4730 Sleep apnea, unspecified: Secondary | ICD-10-CM

## 2020-04-07 MED ORDER — MELOXICAM 7.5 MG PO TABS: 7.5000 mg | ORAL_TABLET | Freq: Every day | ORAL | 1 refills | Status: DC

## 2020-04-07 NOTE — Progress Notes (Signed)
Date:  04/07/2020   Name:  Debbie Bell   DOB:  May 12, 1961   MRN:  [id number]   Chief Complaint: Sleep Apnea (face to face for sleep apnea- snoring and Epworth scale= 20), Ankle Pain (L) ankle swelling with pain going up into lower leg), and Flu Vaccine  Patient is a 59 year old female who presents for aface to face encounter for sleep apnea  exam. The patient reports the following problems: ankle swelling and pain. Health maintenance has been reviewed up to date.  Ankle Pain  The incident occurred more than 1 week ago. There was no injury mechanism. The quality of the pain is described as aching. The pain is at a severity of 3/10. The pain is moderate. The pain has been fluctuating since onset. Associated symptoms include an inability to bear weight, a loss of motion and muscle weakness. Pertinent negatives include no loss of sensation, numbness or tingling. The symptoms are aggravated by weight bearing, movement and palpation. She has tried NSAIDs (cherry juice) for the symptoms. The treatment provided mild relief.    Lab Results  Component Value Date   CREATININE 0.79 04/10/2019   BUN 14 04/10/2019   NA 138 04/10/2019   K 4.3 04/10/2019   CL 99 04/10/2019   CO2 26 04/10/2019   Lab Results  Component Value Date   CHOL 213 (H) 10/23/2015   HDL 71 10/23/2015   LDLCALC 124 (H) 10/23/2015   TRIG 91 10/23/2015   CHOLHDL 3.0 10/23/2015   Lab Results  Component Value Date   TSH 0.641 10/19/2018   No results found for: HGBA1C Lab Results  Component Value Date   WBC 6.4 10/19/2018   HGB 14.4 10/19/2018   HCT 41.2 10/19/2018   MCV 90 10/19/2018   PLT 383 10/19/2018   Lab Results  Component Value Date   ALT 14 04/10/2019   AST 21 04/10/2019   ALKPHOS 96 04/10/2019   BILITOT 0.4 04/10/2019     Review of Systems  Constitutional: Negative.  Negative for chills, fatigue, fever and unexpected weight change.  HENT: Negative for congestion, ear discharge, ear pain,  rhinorrhea, sinus pressure, sneezing and sore throat.   Eyes: Negative for photophobia, pain, discharge, redness and itching.  Respiratory: Negative for cough, shortness of breath, wheezing and stridor.   Gastrointestinal: Negative for abdominal pain, blood in stool, constipation, diarrhea, nausea and vomiting.  Endocrine: Negative for cold intolerance, heat intolerance, polydipsia, polyphagia and polyuria.  Genitourinary: Negative for dysuria, flank pain, frequency, hematuria, menstrual problem, pelvic pain, urgency, vaginal bleeding and vaginal discharge.  Musculoskeletal: Negative for arthralgias, back pain and myalgias.  Skin: Negative for rash.  Allergic/Immunologic: Negative for environmental allergies and food allergies.  Neurological: Negative for dizziness, tingling, weakness, light-headedness, numbness and headaches.  Hematological: Negative for adenopathy. Does not bruise/bleed easily.  Psychiatric/Behavioral: Negative for dysphoric mood. The patient is not nervous/anxious.     Patient Active Problem List   Diagnosis Date Noted  . Persistent cough 04/10/2019    Allergies  Allergen Reactions  . Penicillins     Past Surgical History:  Procedure Laterality Date  . COLONOSCOPY  2015   polyps/ repeat in 2 yrs- Dr Servando Snare  . TONSILLECTOMY      Social History   Tobacco Use  . Smoking status: Never Smoker  . Smokeless tobacco: Never Used  Vaping Use  . Vaping Use: Never used  Substance Use Topics  . Alcohol use: Yes    Alcohol/week: 0.0  standard drinks  . Drug use: No     Medication list has been reviewed and updated.  Current Meds  Medication Sig  . cetirizine-pseudoephedrine (ZYRTEC-D) 5-120 MG tablet Take 1 tablet by mouth 2 (two) times daily.  . citalopram (CELEXA) 40 MG tablet Take 1 tablet (40 mg total) by mouth daily.  Marland Kitchen desoximetasone (TOPICORT) 0.25 % cream Apply bid prn  . hydrocortisone (ANUSOL-HC) 2.5 % rectal cream Place 1 application rectally 2 (two)  times daily. PRN  . meclizine (ANTIVERT) 25 MG tablet Take 1 tablet (25 mg total) by mouth 3 (three) times daily as needed for dizziness.  . montelukast (SINGULAIR) 10 MG tablet TAKE 1 TABLET(10 MG) BY MOUTH AT BEDTIME  . nystatin-triamcinolone (MYCOLOG II) cream Apply topically 2 (two) times daily.  . pantoprazole (PROTONIX) 40 MG tablet TAKE 1 TABLET(40 MG) BY MOUTH DAILY    PHQ 2/9 Scores 02/21/2020 04/10/2019 10/19/2018 04/07/2018  PHQ - 2 Score 0 0 0 1  PHQ- 9 Score 1 2 0 4    GAD 7 : Generalized Anxiety Score 02/21/2020  Nervous, Anxious, on Edge 0  Control/stop worrying 0  Worry too much - different things 0  Trouble relaxing 0  Restless 0  Easily annoyed or irritable 0  Afraid - awful might happen 0  Total GAD 7 Score 0    BP Readings from Last 3 Encounters:  04/07/20 128/80  02/21/20 138/64  04/19/19 118/78    Physical Exam Vitals and nursing note reviewed.  Constitutional:      General: She is not in acute distress.    Appearance: She is not diaphoretic.  HENT:     Head: Normocephalic and atraumatic.     Right Ear: Tympanic membrane, ear canal and external ear normal.     Left Ear: Tympanic membrane, ear canal and external ear normal.     Nose: Nose normal. No congestion or rhinorrhea.     Mouth/Throat:     Mouth: Mucous membranes are moist.  Eyes:     General:        Right eye: No discharge.        Left eye: No discharge.     Conjunctiva/sclera: Conjunctivae normal.     Pupils: Pupils are equal, round, and reactive to light.  Neck:     Thyroid: No thyromegaly.     Vascular: No JVD.  Cardiovascular:     Rate and Rhythm: Normal rate and regular rhythm.     Pulses:          Dorsalis pedis pulses are 1+ on the right side and 1+ on the left side.       Posterior tibial pulses are 1+ on the right side and 1+ on the left side.     Heart sounds: Normal heart sounds. No murmur heard.  No friction rub. No gallop. No S3 or S4 sounds.   Pulmonary:     Effort:  Pulmonary effort is normal.     Breath sounds: Normal breath sounds. No wheezing, rhonchi or rales.  Abdominal:     General: Bowel sounds are normal.     Palpations: Abdomen is soft. There is no mass.     Tenderness: There is no abdominal tenderness. There is no guarding.  Musculoskeletal:        General: Normal range of motion.     Cervical back: Normal range of motion and neck supple.     Right lower leg: 1+ Edema present.     Left  lower leg: 1+ Edema present.     Left ankle: Swelling present. No ecchymosis. No tenderness. Normal range of motion. Anterior drawer test negative. Normal pulse.  Lymphadenopathy:     Cervical: No cervical adenopathy.  Skin:    General: Skin is warm and dry.     Capillary Refill: Capillary refill takes less than 2 seconds.     Findings: No erythema.  Neurological:     Mental Status: She is alert.     Deep Tendon Reflexes: Reflexes are normal and symmetric.     Wt Readings from Last 3 Encounters:  04/07/20 153 lb (69.4 kg)  02/21/20 152 lb (68.9 kg)  04/19/19 150 lb (68 kg)    BP 128/80   Pulse 80   Ht 5\' 5"  (1.651 m)   Wt 153 lb (69.4 kg)   BMI 25.46 kg/m    Assessment and Plan: 1. Encounter for counseling for sleep apnea Patient presents for evaluation and referral for suspected sleep apnea.  On evaluation patient has an Epworth score of 20.  Patient also has had witnessed snoring and apneic episodes by spouse.  We will refer to ENT for evaluation and likely referral for sleep study. - Ambulatory referral to ENT  2. Venous insufficiency of left leg New onset.  Episodic.  Noted more so at the end of the day.  Patient has swelling particularly of the left ankle and some discomfort over the past months.  It is not very tender to suggest gout but is more consistent with arthritis and venous insufficiency.  Patient's been given information on venous insufficiency and encouraged to elevate her leg.  3. Acute left ankle pain New onset.  Episodic.   Persistent.  Noted again more so at the end of the day consistent with arthritis in the ankle.  Patient has had a history of a broken ankle but does not remember which extremity has been involved.  We will initiate meloxicam 7.5 mg daily. - meloxicam (MOBIC) 7.5 MG tablet; Take 1 tablet (7.5 mg total) by mouth daily.  Dispense: 30 tablet; Refill: 1  4. Need for immunization against influenza Discussed and administered. - Flu Vaccine QUAD 36+ mos IM

## 2020-04-07 NOTE — Patient Instructions (Signed)
Low-Sodium Eating Plan Sodium, which is an element that makes up salt, helps you maintain a healthy balance of fluids in your body. Too much sodium can increase your blood pressure and cause fluid and waste to be held in your body. Your health care provider or dietitian may recommend following this plan if you have high blood pressure (hypertension), kidney disease, liver disease, or heart failure. Eating less sodium can help lower your blood pressure, reduce swelling, and protect your heart, liver, and kidneys. What are tips for following this plan? General guidelines  Most people on this plan should limit their sodium intake to 1,500-2,000 mg (milligrams) of sodium each day. Reading food labels   The Nutrition Facts label lists the amount of sodium in one serving of the food. If you eat more than one serving, you must multiply the listed amount of sodium by the number of servings.  Choose foods with less than 140 mg of sodium per serving.  Avoid foods with 300 mg of sodium or more per serving. Shopping  Look for lower-sodium products, often labeled as "low-sodium" or "no salt added."  Always check the sodium content even if foods are labeled as "unsalted" or "no salt added".  Buy fresh foods. ? Avoid canned foods and premade or frozen meals. ? Avoid canned, cured, or processed meats  Buy breads that have less than 80 mg of sodium per slice. Cooking  Eat more home-cooked food and less restaurant, buffet, and fast food.  Avoid adding salt when cooking. Use salt-free seasonings or herbs instead of table salt or sea salt. Check with your health care provider or pharmacist before using salt substitutes.  Cook with plant-based oils, such as canola, sunflower, or olive oil. Meal planning  When eating at a restaurant, ask that your food be prepared with less salt or no salt, if possible.  Avoid foods that contain MSG (monosodium glutamate). MSG is sometimes added to Chinese food,  bouillon, and some canned foods. What foods are recommended? The items listed may not be a complete list. Talk with your dietitian about what dietary choices are best for you. Grains Low-sodium cereals, including oats, puffed wheat and rice, and shredded wheat. Low-sodium crackers. Unsalted rice. Unsalted pasta. Low-sodium bread. Whole-grain breads and whole-grain pasta. Vegetables Fresh or frozen vegetables. "No salt added" canned vegetables. "No salt added" tomato sauce and paste. Low-sodium or reduced-sodium tomato and vegetable juice. Fruits Fresh, frozen, or canned fruit. Fruit juice. Meats and other protein foods Fresh or frozen (no salt added) meat, poultry, seafood, and fish. Low-sodium canned tuna and salmon. Unsalted nuts. Dried peas, beans, and lentils without added salt. Unsalted canned beans. Eggs. Unsalted nut butters. Dairy Milk. Soy milk. Cheese that is naturally low in sodium, such as ricotta cheese, fresh mozzarella, or Swiss cheese Low-sodium or reduced-sodium cheese. Cream cheese. Yogurt. Fats and oils Unsalted butter. Unsalted margarine with no trans fat. Vegetable oils such as canola or olive oils. Seasonings and other foods Fresh and dried herbs and spices. Salt-free seasonings. Low-sodium mustard and ketchup. Sodium-free salad dressing. Sodium-free light mayonnaise. Fresh or refrigerated horseradish. Lemon juice. Vinegar. Homemade, reduced-sodium, or low-sodium soups. Unsalted popcorn and pretzels. Low-salt or salt-free chips. What foods are not recommended? The items listed may not be a complete list. Talk with your dietitian about what dietary choices are best for you. Grains Instant hot cereals. Bread stuffing, pancake, and biscuit mixes. Croutons. Seasoned rice or pasta mixes. Noodle soup cups. Boxed or frozen macaroni and cheese. Regular salted   crackers. Self-rising flour. Vegetables Sauerkraut, pickled vegetables, and relishes. Olives. [place] fries. Onion rings.  Regular canned vegetables (not low-sodium or reduced-sodium). Regular canned tomato sauce and paste (not low-sodium or reduced-sodium). Regular tomato and vegetable juice (not low-sodium or reduced-sodium). Frozen vegetables in sauces. Meats and other protein foods Meat or fish that is salted, canned, smoked, spiced, or pickled. Bacon, ham, sausage, hotdogs, corned beef, chipped beef, packaged lunch meats, salt pork, jerky, pickled herring, anchovies, regular canned tuna, sardines, salted nuts. Dairy Processed cheese and cheese spreads. Cheese curds. Blue cheese. Feta cheese. String cheese. Regular cottage cheese. Buttermilk. Canned milk. Fats and oils Salted butter. Regular margarine. Ghee. Bacon fat. Seasonings and other foods Onion salt, garlic salt, seasoned salt, table salt, and sea salt. Canned and packaged gravies. Worcestershire sauce. Tartar sauce. Barbecue sauce. Teriyaki sauce. Soy sauce, including reduced-sodium. Steak sauce. Fish sauce. Oyster sauce. Cocktail sauce. Horseradish that you find on the shelf. Regular ketchup and mustard. Meat flavorings and tenderizers. Bouillon cubes. Hot sauce and Tabasco sauce. Premade or packaged marinades. Premade or packaged taco seasonings. Relishes. Regular salad dressings. Salsa. Potato and tortilla chips. Corn chips and puffs. Salted popcorn and pretzels. Canned or dried soups. Pizza. Frozen entrees and pot pies. Summary  Eating less sodium can help lower your blood pressure, reduce swelling, and protect your heart, liver, and kidneys.  Most people on this plan should limit their sodium intake to 1,500-2,000 mg (milligrams) of sodium each day.  Canned, boxed, and frozen foods are high in sodium. Restaurant foods, fast foods, and pizza are also very high in sodium. You also get sodium by adding salt to food.  Try to cook at home, eat more fresh fruits and vegetables, and eat less fast food, canned, processed, or prepared foods. This information is  not intended to replace advice given to you by your health care provider. Make sure you discuss any questions you have with your health care provider. Document Revised: 05/06/2017 Document Reviewed: 05/17/2016 Elsevier Patient Education  2020 [employer] Chronic Venous Insufficiency Chronic venous insufficiency is a condition where the leg veins cannot effectively pump blood from the legs to the heart. This happens when the vein walls are either stretched, weakened, or damaged, or when the valves inside the vein are damaged. With the right treatment, you should be able to continue with an active life. This condition is also called venous stasis. What are the causes? Common causes of this condition include:  High blood pressure inside the veins (venous hypertension).  Sitting or standing too long, causing increased blood pressure in the leg veins.  A blood clot that blocks blood flow in a vein (deep vein thrombosis, DVT).  Inflammation of a vein (phlebitis) that causes a blood clot to form.  Tumors in the pelvis that cause blood to back up. What increases the risk? The following factors may make you more likely to develop this condition:  Having a family history of this condition.  Obesity.  Pregnancy.  Living without enough regular physical activity or exercise (sedentary lifestyle).  Smoking.  Having a job that requires long periods of standing or sitting in one place.  Being a certain age. Women in their 43s and 36s and men in their 22s are more likely to develop this condition. What are the signs or symptoms? Symptoms of this condition include:  Veins that are enlarged, bulging, or twisted (varicose veins).  Skin breakdown or ulcers.  Reddened skin or dark discoloration of skin on the leg between  the knee and ankle.  Brown, smooth, tight, and painful skin just above the ankle, usually on the inside of the leg (lipodermatosclerosis).  Swelling of the legs. How is  this diagnosed? This condition may be diagnosed based on:  Your medical history.  A physical exam.  Tests, such as: ? A procedure that creates an image of a blood vessel and nearby organs and provides information about blood flow through the blood vessel (duplex ultrasound). ? A procedure that tests blood flow (plethysmography). ? A procedure that looks at the veins using X-ray and dye (venogram). How is this treated? The goals of treatment are to help you return to an active life and to minimize pain or disability. Treatment depends on the severity of your condition, and it may include:  Wearing compression stockings. These can help relieve symptoms and help prevent your condition from getting worse. However, they do not cure the condition.  Sclerotherapy. This procedure involves an injection of a solution that shrinks damaged veins.  Surgery. This may involve: ? Removing a diseased vein (vein stripping). ? Cutting off blood flow through the vein (laser ablation surgery). ? Repairing or reconstructing a valve within the affected vein. Follow these instructions at home:      Wear compression stockings as told by your health care provider. These stockings help to prevent blood clots and reduce swelling in your legs.  Take over-the-counter and prescription medicines only as told by your health care provider.  Stay active by exercising, walking, or doing different activities. Ask your health care provider what activities are safe for you and how much exercise you need.  Drink enough fluid to keep your urine pale yellow.  Do not use any products that contain nicotine or tobacco, such as cigarettes, e-cigarettes, and chewing tobacco. If you need help quitting, ask your health care provider.  Keep all follow-up visits as told by your health care provider. This is important. Contact a health care provider if you:  Have redness, swelling, or more pain in the affected area.  See a  red streak or line that goes up or down from the affected area.  Have skin breakdown or skin loss in the affected area, even if the breakdown is small.  Get an injury in the affected area. Get help right away if:  You get an injury and an open wound in the affected area.  You have: ? Severe pain that does not get better with medicine. ? Sudden numbness or weakness in the foot or ankle below the affected area. ? Trouble moving your foot or ankle. ? A fever. ? Worse or persistent symptoms. ? Chest pain. ? Shortness of breath. Summary  Chronic venous insufficiency is a condition where the leg veins cannot effectively pump blood from the legs to the heart.  Chronic venous insufficiency occurs when the vein walls become stretched, weakened, or damaged, or when valves within the vein are damaged.  Treatment depends on how severe your condition is. It often involves wearing compression stockings and may involve having a procedure.  Make sure you stay active by exercising, walking, or doing different activities. Ask your health care provider what activities are safe for you and how much exercise you need. This information is not intended to replace advice given to you by your health care provider. Make sure you discuss any questions you have with your health care provider. Document Revised: 02/14/2018 Document Reviewed: 02/14/2018 Elsevier Patient Education  2020 [employer].

## 2020-04-22 DIAGNOSIS — R0683 Snoring: Secondary | ICD-10-CM | POA: Diagnosis not present

## 2020-06-20 ENCOUNTER — Other Ambulatory Visit: Payer: Self-pay | Admitting: Family Medicine

## 2020-06-20 DIAGNOSIS — K219 Gastro-esophageal reflux disease without esophagitis: Secondary | ICD-10-CM

## 2020-07-25 DIAGNOSIS — G4733 Obstructive sleep apnea (adult) (pediatric): Secondary | ICD-10-CM | POA: Diagnosis not present

## 2020-07-27 ENCOUNTER — Other Ambulatory Visit: Payer: Self-pay | Admitting: Family Medicine

## 2020-07-27 DIAGNOSIS — F324 Major depressive disorder, single episode, in partial remission: Secondary | ICD-10-CM

## 2020-07-31 DIAGNOSIS — G4733 Obstructive sleep apnea (adult) (pediatric): Secondary | ICD-10-CM | POA: Diagnosis not present

## 2020-08-28 DIAGNOSIS — G4733 Obstructive sleep apnea (adult) (pediatric): Secondary | ICD-10-CM | POA: Diagnosis not present

## 2020-09-28 DIAGNOSIS — G4733 Obstructive sleep apnea (adult) (pediatric): Secondary | ICD-10-CM | POA: Diagnosis not present

## 2020-10-09 DIAGNOSIS — G4733 Obstructive sleep apnea (adult) (pediatric): Secondary | ICD-10-CM | POA: Diagnosis not present

## 2020-10-28 DIAGNOSIS — G4733 Obstructive sleep apnea (adult) (pediatric): Secondary | ICD-10-CM | POA: Diagnosis not present

## 2020-11-12 ENCOUNTER — Other Ambulatory Visit: Payer: Self-pay | Admitting: Family Medicine

## 2020-11-12 DIAGNOSIS — J301 Allergic rhinitis due to pollen: Secondary | ICD-10-CM

## 2020-11-12 NOTE — Telephone Encounter (Signed)
Requested Prescriptions  Pending Prescriptions Disp Refills  . montelukast (SINGULAIR) 10 MG tablet [Pharmacy Med Name: MONTELUKAST 10MG  TABLETS] 90 tablet 1    Sig: TAKE 1 TABLET BY MOUTH EVERY EVENING     Pulmonology:  Leukotriene Inhibitors Passed - 11/12/2020 10:32 AM      Passed - Valid encounter within last 12 months    Recent Outpatient Visits          7 months ago Encounter for counseling for sleep apnea   [facility] 01/12/2021, MD   8 months ago Major depressive disorder in partial remission, unspecified whether recurrent (HCC)   [facility] Duanne Limerick, MD   1 year ago Major depressive disorder in partial remission, unspecified whether recurrent (HCC)   [facility] Duanne Limerick, MD   2 years ago Major depressive disorder in partial remission, unspecified whether recurrent [facility])   [facility], MD   2 years ago Major depressive disorder in partial remission, unspecified whether recurrent [facility])   [facility], MD

## 2020-11-28 DIAGNOSIS — G4733 Obstructive sleep apnea (adult) (pediatric): Secondary | ICD-10-CM | POA: Diagnosis not present

## 2020-12-20 ENCOUNTER — Other Ambulatory Visit: Payer: Self-pay | Admitting: Family Medicine

## 2020-12-20 DIAGNOSIS — F324 Major depressive disorder, single episode, in partial remission: Secondary | ICD-10-CM

## 2020-12-20 NOTE — Telephone Encounter (Signed)
last RF 07/27/20 #90 1 RF

## 2020-12-25 ENCOUNTER — Ambulatory Visit
Admission: RE | Admit: 2020-12-25 | Discharge: 2020-12-25 | Disposition: A | Discharge status: Home / Self Care | Payer: BC Managed Care – PPO | Attending: Family Medicine | Admitting: Family Medicine

## 2020-12-25 ENCOUNTER — Ambulatory Visit (INDEPENDENT_AMBULATORY_CARE_PROVIDER_SITE_OTHER): Payer: BC Managed Care – PPO | Admitting: Family Medicine

## 2020-12-25 ENCOUNTER — Encounter: Payer: Self-pay | Admitting: Family Medicine

## 2020-12-25 ENCOUNTER — Ambulatory Visit
Admission: RE | Admit: 2020-12-25 | Discharge: 2020-12-25 | Disposition: A | Discharge status: Home / Self Care | Payer: BC Managed Care – PPO | Source: Ambulatory Visit | Attending: Family Medicine | Admitting: Family Medicine

## 2020-12-25 ENCOUNTER — Other Ambulatory Visit: Payer: Self-pay

## 2020-12-25 VITALS — BP 130/80 | HR 88 | Ht 65.0 in | Wt 157.0 lb

## 2020-12-25 DIAGNOSIS — F324 Major depressive disorder, single episode, in partial remission: Secondary | ICD-10-CM

## 2020-12-25 DIAGNOSIS — K219 Gastro-esophageal reflux disease without esophagitis: Secondary | ICD-10-CM | POA: Diagnosis not present

## 2020-12-25 DIAGNOSIS — E785 Hyperlipidemia, unspecified: Secondary | ICD-10-CM | POA: Diagnosis not present

## 2020-12-25 DIAGNOSIS — M25572 Pain in left ankle and joints of left foot: Secondary | ICD-10-CM | POA: Insufficient documentation

## 2020-12-25 DIAGNOSIS — R69 Illness, unspecified: Secondary | ICD-10-CM | POA: Diagnosis not present

## 2020-12-25 DIAGNOSIS — J301 Allergic rhinitis due to pollen: Secondary | ICD-10-CM

## 2020-12-25 MED ORDER — PANTOPRAZOLE SODIUM 40 MG PO TBEC: DELAYED_RELEASE_TABLET | ORAL | 1 refills | Status: DC

## 2020-12-25 MED ORDER — MONTELUKAST SODIUM 10 MG PO TABS: ORAL_TABLET | ORAL | 1 refills | Status: DC

## 2020-12-25 MED ORDER — CITALOPRAM HYDROBROMIDE 40 MG PO TABS: 40.0000 mg | ORAL_TABLET | Freq: Every day | ORAL | 1 refills | Status: DC

## 2020-12-25 MED ORDER — CETIRIZINE-PSEUDOEPHEDRINE ER 5-120 MG PO TB12: 1.0000 | ORAL_TABLET | Freq: Two times a day (BID) | ORAL | 1 refills | Status: DC

## 2020-12-25 NOTE — Progress Notes (Signed)
Date:  12/25/2020   Name:  Debbie Bell   DOB:  25-Apr-1961   MRN:  [id number]   Chief Complaint: Gastroesophageal Reflux, Depression, and Allergic Rhinitis   Gastroesophageal Reflux She complains of coughing and heartburn. She reports no abdominal pain, no chest pain, no dysphagia, no nausea, no sore throat or no wheezing. This is a chronic problem. The problem has been waxing and waning. Pertinent negatives include no fatigue. She has tried a PPI for the symptoms. The treatment provided mild relief.  Depression        This is a chronic problem.  Associated symptoms include no decreased concentration, no fatigue, no helplessness, no hopelessness, does not have insomnia, not irritable, no restlessness, no decreased interest, no appetite change, no body aches, no myalgias, no headaches, not sad and no suicidal ideas.  Past treatments include SSRIs - Selective serotonin reuptake inhibitors.  Compliance with treatment is good. Leg Pain  The pain is mild. The pain has been Fluctuating since onset. Associated symptoms include muscle weakness. Pertinent negatives include no numbness.  Foot Injury  The pain is present in the left foot. Associated symptoms include muscle weakness. Pertinent negatives include no numbness.  URI  Chronicity: for allergies. The current episode started more than 1 year ago. The problem has been waxing and waning. Associated symptoms include congestion, coughing and rhinorrhea. Pertinent negatives include no abdominal pain, chest pain, diarrhea, dysuria, ear pain, headaches, nausea, neck pain, rash, sore throat or wheezing.   Lab Results  Component Value Date   CREATININE 0.79 04/10/2019   BUN 14 04/10/2019   NA 138 04/10/2019   K 4.3 04/10/2019   CL 99 04/10/2019   CO2 26 04/10/2019   Lab Results  Component Value Date   CHOL 213 (H) 10/23/2015   HDL 71 10/23/2015   LDLCALC 124 (H) 10/23/2015   TRIG 91 10/23/2015   CHOLHDL 3.0 10/23/2015   Lab Results   Component Value Date   TSH 0.641 10/19/2018   No results found for: HGBA1C Lab Results  Component Value Date   WBC 6.4 10/19/2018   HGB 14.4 10/19/2018   HCT 41.2 10/19/2018   MCV 90 10/19/2018   PLT 383 10/19/2018   Lab Results  Component Value Date   ALT 14 04/10/2019   AST 21 04/10/2019   ALKPHOS 96 04/10/2019   BILITOT 0.4 04/10/2019     Review of Systems  Constitutional:  Negative for appetite change, chills, fatigue and fever.  HENT:  Positive for congestion and rhinorrhea. Negative for drooling, ear discharge, ear pain and sore throat.   Respiratory:  Positive for cough. Negative for shortness of breath and wheezing.   Cardiovascular:  Negative for chest pain, palpitations and leg swelling.  Gastrointestinal:  Positive for heartburn. Negative for abdominal pain, blood in stool, constipation, diarrhea, dysphagia and nausea.  Endocrine: Negative for polydipsia.  Genitourinary:  Negative for dysuria, frequency, hematuria and urgency.  Musculoskeletal:  Negative for back pain, myalgias and neck pain.  Skin:  Negative for rash.  Allergic/Immunologic: Negative for environmental allergies.  Neurological:  Negative for dizziness, numbness and headaches.  Hematological:  Does not bruise/bleed easily.  Psychiatric/Behavioral:  Positive for depression. Negative for decreased concentration and suicidal ideas. The patient is not nervous/anxious and does not have insomnia.    Patient Active Problem List   Diagnosis Date Noted   Persistent cough 04/10/2019    Allergies  Allergen Reactions   Penicillins     Past Surgical History:  Procedure Laterality Date   COLONOSCOPY  2015   polyps/ repeat in 2 yrs- Dr Servando Snare   TONSILLECTOMY      Social History   Tobacco Use   Smoking status: Never   Smokeless tobacco: Never  Vaping Use   Vaping Use: Never used  Substance Use Topics   Alcohol use: Yes    Alcohol/week: 0.0 standard drinks   Drug use: No     Medication list  has been reviewed and updated.  Current Meds  Medication Sig   cetirizine-pseudoephedrine (ZYRTEC-D) 5-120 MG tablet Take 1 tablet by mouth 2 (two) times daily.   citalopram (CELEXA) 40 MG tablet TAKE 1 TABLET BY MOUTH DAILY   desoximetasone (TOPICORT) 0.25 % cream Apply bid prn   hydrocortisone (ANUSOL-HC) 2.5 % rectal cream Place 1 application rectally 2 (two) times daily. PRN   meclizine (ANTIVERT) 25 MG tablet Take 1 tablet (25 mg total) by mouth 3 (three) times daily as needed for dizziness.   meloxicam (MOBIC) 7.5 MG tablet Take 1 tablet (7.5 mg total) by mouth daily.   montelukast (SINGULAIR) 10 MG tablet TAKE 1 TABLET BY MOUTH EVERY EVENING   nystatin-triamcinolone (MYCOLOG II) cream Apply topically 2 (two) times daily.   pantoprazole (PROTONIX) 40 MG tablet TAKE 1 TABLET BY MOUTH EVERY DAY    PHQ 2/9 Scores 12/25/2020 02/21/2020 04/10/2019 10/19/2018  PHQ - 2 Score 0 0 0 0  PHQ- 9 Score 0 1 2 0    GAD 7 : Generalized Anxiety Score 12/25/2020 02/21/2020  Nervous, Anxious, on Edge 0 0  Control/stop worrying 0 0  Worry too much - different things 0 0  Trouble relaxing 0 0  Restless 0 0  Easily annoyed or irritable 0 0  Afraid - awful might happen 0 0  Total GAD 7 Score 0 0    BP Readings from Last 3 Encounters:  12/25/20 130/80  04/07/20 128/80  02/21/20 138/64    Physical Exam Vitals and nursing note reviewed.  Constitutional:      General: She is not irritable.    Appearance: She is well-developed.  HENT:     Head: Normocephalic.     Right Ear: Tympanic membrane, ear canal and external ear normal.     Left Ear: Tympanic membrane, ear canal and external ear normal.     Nose: Nose normal. No congestion or rhinorrhea.  Eyes:     General: Lids are everted, no foreign bodies appreciated. No scleral icterus.       Left eye: No foreign body or hordeolum.     Conjunctiva/sclera: Conjunctivae normal.     Right eye: Right conjunctiva is not injected.     Left eye: Left  conjunctiva is not injected.     Pupils: Pupils are equal, round, and reactive to light.  Neck:     Thyroid: No thyromegaly.     Vascular: No JVD.     Trachea: No tracheal deviation.  Cardiovascular:     Rate and Rhythm: Normal rate and regular rhythm.     Heart sounds: Normal heart sounds. No murmur (dg ankle left) heard.   No friction rub. No gallop.  Pulmonary:     Effort: Pulmonary effort is normal. No respiratory distress.     Breath sounds: Normal breath sounds. No wheezing, rhonchi or rales.  Abdominal:     General: Bowel sounds are normal.     Palpations: Abdomen is soft. There is no mass.     Tenderness: There is no  abdominal tenderness. There is no guarding or rebound.  Musculoskeletal:        General: No tenderness. Normal range of motion.     Cervical back: Normal range of motion and neck supple.  Lymphadenopathy:     Cervical: No cervical adenopathy.  Skin:    General: Skin is warm.     Findings: No rash.  Neurological:     Mental Status: She is alert and oriented to person, place, and time.     Cranial Nerves: No cranial nerve deficit.     Deep Tendon Reflexes: Reflexes normal.  Psychiatric:        Mood and Affect: Mood is not anxious or depressed.    Wt Readings from Last 3 Encounters:  12/25/20 157 lb (71.2 kg)  04/07/20 153 lb (69.4 kg)  02/21/20 152 lb (68.9 kg)    BP 130/80   Pulse 88   Ht 5\' 5"  (1.651 m)   Wt 157 lb (71.2 kg)   BMI 26.13 kg/m   Assessment and Plan:  1. Gastroesophageal reflux disease, unspecified whether esophagitis present Chronic.  Controlled.  Stable.  Continue pantoprazole 40 mg once a day. - pantoprazole (PROTONIX) 40 MG tablet; TAKE 1 TABLET BY MOUTH EVERY DAY  Dispense: 90 tablet; Refill: 1  2. Seasonal allergic rhinitis due to pollen Chronic.  Controlled.  Stable.  Continue Zyrtec-D and Singulair 10 mg 1 nightly - cetirizine-pseudoephedrine (ZYRTEC-D) 5-120 MG tablet; Take 1 tablet by mouth 2 (two) times daily.   Dispense: 180 tablet; Refill: 1 - montelukast (SINGULAIR) 10 MG tablet; TAKE 1 TABLET BY MOUTH EVERY EVENING  Dispense: 90 tablet; Refill: 1  3. Major depressive disorder in partial remission, unspecified whether recurrent (HCC) Chronic.  Controlled.  Stable.  PHQ normal at 0 as well as gad score 0.  Continue citalopram 40 mg once a day. - citalopram (CELEXA) 40 MG tablet; Take 1 tablet (40 mg total) by mouth daily.  Dispense: 90 tablet; Refill: 1  4. Acute left ankle pain Patient's continued to have ankle pain from visit on 04/07/2020.  There is tenderness of the posterior malleoli with some swelling.  We will x-ray to make sure there is no residual concern. - DG Ankle Complete Left; Future  5. Hyperlipidemia, unspecified hyperlipidemia type .  Controlled.  Stable.  Patient has not been checked for lipids and has had mild elevation in the past we will check lipid and address accordingly. - Lipid Panel With LDL/HDL Ratio  6. Taking medication for chronic disease Patient is on medications and had a mild elevation of sodium on her last renal panel we will recheck renal function panel at this time. - Renal Function Panel

## 2020-12-26 LAB — RENAL FUNCTION PANEL
Albumin: 4.5 g/dL (ref 3.8–4.9)
BUN/Creatinine Ratio: 15 (ref 9–23)
BUN: 13 mg/dL (ref 6–24)
CO2: 26 mmol/L (ref 20–29)
Calcium: 9.5 mg/dL (ref 8.7–10.2)
Chloride: 100 mmol/L (ref 96–106)
Creatinine, Ser: 0.84 mg/dL (ref 0.57–1.00)
Glucose: 75 mg/dL (ref 65–99)
Phosphorus: 3.3 mg/dL (ref 3.0–4.3)
Potassium: 4.7 mmol/L (ref 3.5–5.2)
Sodium: 140 mmol/L (ref 134–144)
eGFR: 80 mL/min/{1.73_m2} (ref >59)

## 2020-12-26 LAB — LIPID PANEL WITH LDL/HDL RATIO
Cholesterol, Total: 201 mg/dL — ABNORMAL HIGH (ref 100–199)
HDL: 60 mg/dL (ref >39)
LDL Chol Calc (NIH): 124 mg/dL — ABNORMAL HIGH (ref 0–99)
LDL/HDL Ratio: 2.1 ratio (ref 0.0–3.2)
Triglycerides: 95 mg/dL (ref 0–149)
VLDL Cholesterol Cal: 17 mg/dL (ref 5–40)

## 2020-12-28 DIAGNOSIS — G4733 Obstructive sleep apnea (adult) (pediatric): Secondary | ICD-10-CM | POA: Diagnosis not present

## 2021-01-28 DIAGNOSIS — G4733 Obstructive sleep apnea (adult) (pediatric): Secondary | ICD-10-CM | POA: Diagnosis not present

## 2021-02-28 DIAGNOSIS — G4733 Obstructive sleep apnea (adult) (pediatric): Secondary | ICD-10-CM | POA: Diagnosis not present

## 2021-03-30 DIAGNOSIS — G4733 Obstructive sleep apnea (adult) (pediatric): Secondary | ICD-10-CM | POA: Diagnosis not present

## 2021-06-17 ENCOUNTER — Other Ambulatory Visit: Payer: Self-pay | Admitting: Family Medicine

## 2021-06-17 DIAGNOSIS — F324 Major depressive disorder, single episode, in partial remission: Secondary | ICD-10-CM

## 2021-06-17 NOTE — Telephone Encounter (Signed)
Requested Prescriptions  Pending Prescriptions Disp Refills   citalopram (CELEXA) 40 MG tablet [Pharmacy Med Name: CITALOPRAM 40MG  TABLETS] 90 tablet 1    Sig: TAKE 1 TABLET BY MOUTH DAILY     Psychiatry:  Antidepressants - SSRI Passed - 06/17/2021 10:32 AM      Passed - Valid encounter within last 6 months    Recent Outpatient Visits          5 months ago Gastroesophageal reflux disease, unspecified whether esophagitis present   [facility] Juline Patch, MD   1 year ago Encounter for counseling for sleep apnea   [facility] Juline Patch, MD   1 year ago Major depressive disorder in partial remission, unspecified whether recurrent [facility])   [facility] Juline Patch, MD   2 years ago Major depressive disorder in partial remission, unspecified whether recurrent [facility])   [facility] Juline Patch, MD   2 years ago Major depressive disorder in partial remission, unspecified whether recurrent [facility])   [facility] Juline Patch, MD      Future Appointments            In 1 week Juline Patch, MD [facility]

## 2021-06-29 ENCOUNTER — Ambulatory Visit: Payer: Self-pay | Admitting: Family Medicine

## 2021-07-07 ENCOUNTER — Other Ambulatory Visit: Payer: Self-pay

## 2021-07-07 ENCOUNTER — Ambulatory Visit (INDEPENDENT_AMBULATORY_CARE_PROVIDER_SITE_OTHER): Payer: BC Managed Care – PPO | Admitting: Family Medicine

## 2021-07-07 ENCOUNTER — Encounter: Payer: Self-pay | Admitting: Family Medicine

## 2021-07-07 VITALS — BP 100/62 | HR 64 | Ht 65.0 in | Wt 152.0 lb

## 2021-07-07 DIAGNOSIS — E785 Hyperlipidemia, unspecified: Secondary | ICD-10-CM | POA: Diagnosis not present

## 2021-07-07 DIAGNOSIS — Z23 Encounter for immunization: Secondary | ICD-10-CM | POA: Diagnosis not present

## 2021-07-07 DIAGNOSIS — F324 Major depressive disorder, single episode, in partial remission: Secondary | ICD-10-CM

## 2021-07-07 DIAGNOSIS — R69 Illness, unspecified: Secondary | ICD-10-CM

## 2021-07-07 DIAGNOSIS — K219 Gastro-esophageal reflux disease without esophagitis: Secondary | ICD-10-CM

## 2021-07-07 DIAGNOSIS — J301 Allergic rhinitis due to pollen: Secondary | ICD-10-CM

## 2021-07-07 DIAGNOSIS — L259 Unspecified contact dermatitis, unspecified cause: Secondary | ICD-10-CM | POA: Diagnosis not present

## 2021-07-07 DIAGNOSIS — K649 Unspecified hemorrhoids: Secondary | ICD-10-CM

## 2021-07-07 DIAGNOSIS — B354 Tinea corporis: Secondary | ICD-10-CM

## 2021-07-07 MED ORDER — NYSTATIN-TRIAMCINOLONE 100000-0.1 UNIT/GM-% EX CREA: TOPICAL_CREAM | Freq: Two times a day (BID) | CUTANEOUS | 1 refills | Status: DC

## 2021-07-07 MED ORDER — CITALOPRAM HYDROBROMIDE 40 MG PO TABS: 40.0000 mg | ORAL_TABLET | Freq: Every day | ORAL | 1 refills | Status: DC

## 2021-07-07 MED ORDER — PANTOPRAZOLE SODIUM 40 MG PO TBEC: DELAYED_RELEASE_TABLET | ORAL | 1 refills | Status: DC

## 2021-07-07 MED ORDER — HYDROCORTISONE (PERIANAL) 2.5 % EX CREA: 1.0000 "application " | TOPICAL_CREAM | Freq: Two times a day (BID) | CUTANEOUS | 1 refills | Status: DC

## 2021-07-07 MED ORDER — MONTELUKAST SODIUM 10 MG PO TABS: ORAL_TABLET | ORAL | 1 refills | Status: DC

## 2021-07-07 NOTE — Progress Notes (Signed)
Date:  07/07/2021   Name:  Debbie Bell   DOB:  [birth year]   MRN:  [id number]   Chief Complaint: Gastroesophageal Reflux, Allergic Rhinitis , Hemorrhoids, and Rash (Yeast skin folds)  Gastroesophageal Reflux She complains of heartburn. She reports no abdominal pain, no belching, no chest pain, no coughing, no dysphagia, no nausea, no sore throat or no wheezing. This is a chronic problem. The current episode started more than 1 year ago. The problem occurs frequently. The problem has been waxing and waning. The heartburn is located in the substernum. The symptoms are aggravated by certain foods. Pertinent negatives include no anemia, fatigue, melena, muscle weakness, orthopnea or weight loss. She has tried a PPI for the symptoms.  Rash This is a recurrent problem. The current episode started more than 1 year ago. The affected locations include the groin. The rash is characterized by redness (pruritic). Pertinent negatives include no cough, diarrhea, fatigue, fever, shortness of breath or sore throat. Past treatments include topical steroids (antifungal). The treatment provided moderate relief.  URI  This is a chronic problem. The current episode started more than 1 year ago. The problem has been gradually improving. There has been no fever. Associated symptoms include a rash. Pertinent negatives include no abdominal pain, chest pain, coughing, diarrhea, dysuria, ear pain, headaches, nausea, neck pain, sore throat or wheezing.  Depression        This is a chronic problem.  Associated symptoms include indigestion.  Associated symptoms include no decreased concentration, no fatigue, no helplessness, no hopelessness, does not have insomnia, not irritable, no restlessness, no decreased interest, no appetite change, no body aches, no myalgias, no headaches, not sad and no suicidal ideas.( "Need motivation")  Lab Results  Component Value Date   NA 140 12/25/2020   K 4.7 12/25/2020   CO2 26  12/25/2020   GLUCOSE 75 12/25/2020   BUN 13 12/25/2020   CREATININE 0.84 12/25/2020   CALCIUM 9.5 12/25/2020   EGFR 80 12/25/2020   GFRNONAA 84 04/10/2019   Lab Results  Component Value Date   CHOL 201 (H) 12/25/2020   HDL 60 12/25/2020   LDLCALC 124 (H) 12/25/2020   TRIG 95 12/25/2020   CHOLHDL 3.0 10/23/2015   Lab Results  Component Value Date   TSH 0.641 10/19/2018   No results found for: HGBA1C Lab Results  Component Value Date   WBC 6.4 10/19/2018   HGB 14.4 10/19/2018   HCT 41.2 10/19/2018   MCV 90 10/19/2018   PLT 383 10/19/2018   Lab Results  Component Value Date   ALT 14 04/10/2019   AST 21 04/10/2019   ALKPHOS 96 04/10/2019   BILITOT 0.4 04/10/2019   No results found for: 25OHVITD2, 25OHVITD3, VD25OH   Review of Systems  Constitutional:  Negative for appetite change, chills, fatigue, fever and weight loss.  HENT:  Negative for drooling, ear discharge, ear pain and sore throat.   Respiratory:  Negative for cough, shortness of breath and wheezing.   Cardiovascular:  Negative for chest pain, palpitations and leg swelling.  Gastrointestinal:  Positive for heartburn. Negative for abdominal pain, blood in stool, constipation, diarrhea, dysphagia, melena and nausea.  Endocrine: Negative for polydipsia.  Genitourinary:  Negative for dysuria, frequency, hematuria and urgency.  Musculoskeletal:  Negative for back pain, myalgias, muscle weakness and neck pain.  Skin:  Positive for rash.  Allergic/Immunologic: Negative for environmental allergies.  Neurological:  Negative for dizziness and headaches.  Hematological:  Does not bruise/bleed easily.  Psychiatric/Behavioral:  Positive for depression. Negative for decreased concentration and suicidal ideas. The patient is not nervous/anxious and does not have insomnia.    Patient Active Problem List   Diagnosis Date Noted   Persistent cough 04/10/2019    Allergies  Allergen Reactions   Penicillins     Past  Surgical History:  Procedure Laterality Date   COLONOSCOPY  2015   polyps/ repeat in 2 yrs- Dr Allen Norris   TONSILLECTOMY      Social History   Tobacco Use   Smoking status: Never   Smokeless tobacco: Never  Vaping Use   Vaping Use: Never used  Substance Use Topics   Alcohol use: Yes    Alcohol/week: 0.0 standard drinks   Drug use: No     Medication list has been reviewed and updated.  Current Meds  Medication Sig   cetirizine-pseudoephedrine (ZYRTEC-D) 5-120 MG tablet Take 1 tablet by mouth 2 (two) times daily.   citalopram (CELEXA) 40 MG tablet TAKE 1 TABLET BY MOUTH DAILY   desoximetasone (TOPICORT) 0.25 % cream Apply bid prn   meclizine (ANTIVERT) 25 MG tablet Take 1 tablet (25 mg total) by mouth 3 (three) times daily as needed for dizziness.   meloxicam (MOBIC) 7.5 MG tablet Take 1 tablet (7.5 mg total) by mouth daily.   montelukast (SINGULAIR) 10 MG tablet TAKE 1 TABLET BY MOUTH EVERY EVENING   nystatin-triamcinolone (MYCOLOG II) cream Apply topically 2 (two) times daily.   pantoprazole (PROTONIX) 40 MG tablet TAKE 1 TABLET BY MOUTH EVERY DAY   [DISCONTINUED] hydrocortisone (ANUSOL-HC) 2.5 % rectal cream Place 1 application rectally 2 (two) times daily. PRN    PHQ 2/9 Scores 07/07/2021 12/25/2020 02/21/2020 04/10/2019  PHQ - 2 Score 0 0 0 0  PHQ- 9 Score 0 0 1 2    GAD 7 : Generalized Anxiety Score 07/07/2021 12/25/2020 02/21/2020  Nervous, Anxious, on Edge 0 0 0  Control/stop worrying 0 0 0  Worry too much - different things 0 0 0  Trouble relaxing 0 0 0  Restless 0 0 0  Easily annoyed or irritable 0 0 0  Afraid - awful might happen 0 0 0  Total GAD 7 Score 0 0 0  Anxiety Difficulty Not difficult at all - -    BP Readings from Last 3 Encounters:  07/07/21 100/62  12/25/20 130/80  04/07/20 128/80    Physical Exam Vitals and nursing note reviewed.  Constitutional:      General: She is not irritable.    Appearance: She is well-developed.  HENT:     Head:  Normocephalic.     Right Ear: External ear normal.     Left Ear: External ear normal.  Eyes:     General: Lids are everted, no foreign bodies appreciated. No scleral icterus.       Left eye: No foreign body or hordeolum.     Conjunctiva/sclera: Conjunctivae normal.     Right eye: Right conjunctiva is not injected.     Left eye: Left conjunctiva is not injected.     Pupils: Pupils are equal, round, and reactive to light.  Neck:     Thyroid: No thyromegaly.     Vascular: No JVD.     Trachea: No tracheal deviation.  Cardiovascular:     Rate and Rhythm: Normal rate and regular rhythm.     Heart sounds: Normal heart sounds, S1 normal and S2 normal. No murmur heard. No systolic murmur is present.  No diastolic murmur  is present.    No friction rub. No gallop. No S3 or S4 sounds.  Pulmonary:     Effort: Pulmonary effort is normal. No respiratory distress.     Breath sounds: Normal breath sounds. No decreased breath sounds, wheezing, rhonchi or rales.  Abdominal:     General: Bowel sounds are normal.     Palpations: Abdomen is soft. There is no hepatomegaly, splenomegaly or mass.     Tenderness: There is no abdominal tenderness. There is no guarding or rebound.  Musculoskeletal:        General: No tenderness. Normal range of motion.     Cervical back: Normal range of motion and neck supple.  Lymphadenopathy:     Cervical: No cervical adenopathy.  Skin:    General: Skin is warm.     Findings: No rash.  Neurological:     Mental Status: She is alert and oriented to person, place, and time.     Cranial Nerves: No cranial nerve deficit.     Deep Tendon Reflexes: Reflexes normal.  Psychiatric:        Mood and Affect: Mood is not anxious or depressed.    Wt Readings from Last 3 Encounters:  07/07/21 152 lb (68.9 kg)  12/25/20 157 lb (71.2 kg)  04/07/20 153 lb (69.4 kg)    BP 100/62    Pulse 64    Ht 5' 5"  (1.651 m)    Wt 152 lb (68.9 kg)    BMI 25.29 kg/m   Assessment and  Plan:  1. Gastroesophageal reflux disease, unspecified whether esophagitis present Chronic.  Partially controlled.  Stable.  Patient is having some breakthrough particularly at night of heartburn.  In addition to maintaining pantoprazole 40 mg once a day I have also suggested obtaining some over-the-counter famotidine 20 mg 1 nightly along with Tums.  If this should continue our next step would be referral to GI. - pantoprazole (PROTONIX) 40 MG tablet; TAKE 1 TABLET BY MOUTH EVERY DAY  Dispense: 90 tablet; Refill: 1  2. Seasonal allergic rhinitis due to pollen Chronic.  Controlled.  Stable.  Continue Singulair 10 mg once a day. - montelukast (SINGULAIR) 10 MG tablet; TAKE 1 TABLET BY MOUTH EVERY EVENING  Dispense: 90 tablet; Refill: 1  3. Major depressive disorder in partial remission, unspecified whether recurrent (HCC) Chronic.  Controlled.  Stable.  PHQ is 0 Gad score is 0.  Continue citalopram 40 mg once a day.  We will recheck in 6 months. - citalopram (CELEXA) 40 MG tablet; Take 1 tablet (40 mg total) by mouth daily.  Dispense: 90 tablet; Refill: 1  4. Contact dermatitis Rare episodes of contact dermatitis in response to nystatin triamcinolone. - nystatin-triamcinolone (MYCOLOG II) cream; Apply topically 2 (two) times daily.  Dispense: 30 g; Refill: 1  5. Tinea corporis As noted above. - nystatin-triamcinolone (MYCOLOG II) cream; Apply topically 2 (two) times daily.  Dispense: 30 g; Refill: 1  6. Hemorrhoids, unspecified hemorrhoid type Patient with history of hemorrhoids which responds to Anusol HC 2.5 twice a day. - hydrocortisone (ANUSOL-HC) 2.5 % rectal cream; Place 1 application rectally 2 (two) times daily.  Dispense: 30 g; Refill: 1  7. Taking medication for chronic disease Patient with a history of taking medications for lipid and others we will check lipid and CMP. - Lipid Panel With LDL/HDL Ratio - Comprehensive Metabolic Panel (CMET)  8. Hyperlipidemia, unspecified  hyperlipidemia type Noted to have elevated cholesterol that is controlled with diet.  Will check lipid  panel. - Lipid Panel With LDL/HDL Ratio  9. Need for immunization against influenza Discussed and administered. - Flu Vaccine QUAD 36moIM (Fluarix, Fluzone & Alfiuria Quad PF)

## 2021-07-08 ENCOUNTER — Other Ambulatory Visit: Payer: Self-pay | Admitting: Family Medicine

## 2021-07-08 DIAGNOSIS — J301 Allergic rhinitis due to pollen: Secondary | ICD-10-CM

## 2021-07-08 LAB — LIPID PANEL WITH LDL/HDL RATIO
Cholesterol, Total: 192 mg/dL (ref 100–199)
HDL: 52 mg/dL (ref >39)
LDL Chol Calc (NIH): 123 mg/dL — ABNORMAL HIGH (ref 0–99)
LDL/HDL Ratio: 2.4 ratio (ref 0.0–3.2)
Triglycerides: 93 mg/dL (ref 0–149)
VLDL Cholesterol Cal: 17 mg/dL (ref 5–40)

## 2021-07-08 LAB — COMPREHENSIVE METABOLIC PANEL
ALT: 15 IU/L (ref 0–32)
AST: 18 IU/L (ref 0–40)
Albumin/Globulin Ratio: 1.5 (ref 1.2–2.2)
Albumin: 4.6 g/dL (ref 3.8–4.9)
Alkaline Phosphatase: 95 IU/L (ref 44–121)
BUN/Creatinine Ratio: 11 (ref 9–23)
BUN: 9 mg/dL (ref 6–24)
Bilirubin Total: 0.4 mg/dL (ref 0.0–1.2)
CO2: 23 mmol/L (ref 20–29)
Calcium: 10.2 mg/dL (ref 8.7–10.2)
Chloride: 102 mmol/L (ref 96–106)
Creatinine, Ser: 0.82 mg/dL (ref 0.57–1.00)
Globulin, Total: 3.1 g/dL (ref 1.5–4.5)
Glucose: 84 mg/dL (ref 70–99)
Potassium: 4.7 mmol/L (ref 3.5–5.2)
Sodium: 143 mmol/L (ref 134–144)
Total Protein: 7.7 g/dL (ref 6.0–8.5)
eGFR: 83 mL/min/{1.73_m2} (ref >59)

## 2021-07-08 NOTE — Telephone Encounter (Signed)
[facility] called and spoke to [place], [employer] about the refill(s) Singular requested. Advised it was sent on 07/07/21 #90/1 refill(s). He states that medication was received and will be mailed out to patient. This request can be refused.   Requested Prescriptions  Pending Prescriptions Disp Refills   montelukast (SINGULAIR) 10 MG tablet [Pharmacy Med Name: MONTELUKAST 10MG  TABLETS] 90 tablet 1    Sig: TAKE 1 TABLET BY MOUTH EVERY EVENING     Pulmonology:  Leukotriene Inhibitors Passed - 07/08/2021 10:33 AM      Passed - Valid encounter within last 12 months    Recent Outpatient Visits           Yesterday Gastroesophageal reflux disease, unspecified whether esophagitis present   [facility]   6 months ago Gastroesophageal reflux disease, unspecified whether esophagitis present   [facility]   1 year ago Encounter for counseling for sleep apnea   [facility] Duanne Limerick, MD   1 year ago Major depressive disorder in partial remission, unspecified whether recurrent [facility])   [facility], MD   2 years ago Major depressive disorder in partial remission, unspecified whether recurrent [facility])   [facility], MD       Future Appointments             In 6 months Duanne Limerick, MD [facility]

## 2021-09-02 ENCOUNTER — Encounter: Payer: Self-pay | Admitting: Family Medicine

## 2021-09-02 ENCOUNTER — Other Ambulatory Visit: Payer: Self-pay

## 2021-09-02 DIAGNOSIS — K219 Gastro-esophageal reflux disease without esophagitis: Secondary | ICD-10-CM

## 2021-09-02 MED ORDER — PANTOPRAZOLE SODIUM 40 MG PO TBEC: DELAYED_RELEASE_TABLET | ORAL | 0 refills | Status: DC

## 2021-09-25 ENCOUNTER — Other Ambulatory Visit: Payer: Self-pay | Admitting: Family Medicine

## 2021-09-25 DIAGNOSIS — F324 Major depressive disorder, single episode, in partial remission: Secondary | ICD-10-CM

## 2021-11-03 ENCOUNTER — Ambulatory Visit (INDEPENDENT_AMBULATORY_CARE_PROVIDER_SITE_OTHER): Payer: BC Managed Care – PPO | Admitting: Family Medicine

## 2021-11-03 ENCOUNTER — Encounter: Payer: Self-pay | Admitting: Family Medicine

## 2021-11-03 VITALS — BP 124/68 | HR 72 | Ht 65.0 in | Wt 157.0 lb

## 2021-11-03 DIAGNOSIS — J0100 Acute maxillary sinusitis, unspecified: Secondary | ICD-10-CM | POA: Diagnosis not present

## 2021-11-03 DIAGNOSIS — J309 Allergic rhinitis, unspecified: Secondary | ICD-10-CM | POA: Diagnosis not present

## 2021-11-03 DIAGNOSIS — R053 Chronic cough: Secondary | ICD-10-CM

## 2021-11-03 DIAGNOSIS — S0511XA Contusion of eyeball and orbital tissues, right eye, initial encounter: Secondary | ICD-10-CM

## 2021-11-03 MED ORDER — AZITHROMYCIN 250 MG PO TABS: ORAL_TABLET | ORAL | 1 refills | Status: AC

## 2021-11-03 MED ORDER — GUAIFENESIN-CODEINE 100-10 MG/5ML PO SYRP: 5.0000 mL | ORAL_SOLUTION | Freq: Three times a day (TID) | ORAL | 0 refills | Status: DC | PRN

## 2021-11-03 NOTE — Progress Notes (Signed)
Date:  11/03/2021   Name:  Debbie Bell   DOB:  11/11/1960   MRN:  [id number]   Chief Complaint: Cough (Woke up this am with black eye- cough is somewhat productive, but not always/ white production)  Cough This is a new problem. The current episode started in the past 7 days. The problem has been waxing and waning. The problem occurs every few minutes. The cough is Productive of sputum. Associated symptoms include headaches. Pertinent negatives include no chest pain, chills, ear congestion, ear pain, fever, hemoptysis, myalgias, nasal congestion, postnasal drip, rash, rhinorrhea, sore throat, shortness of breath or wheezing. Associated symptoms comments: "Labored breathing". Treatments tried: mucinex max/antihistamine. The treatment provided mild relief. There is no history of environmental allergies.   Lab Results  Component Value Date   NA 143 07/07/2021   K 4.7 07/07/2021   CO2 23 07/07/2021   GLUCOSE 84 07/07/2021   BUN 9 07/07/2021   CREATININE 0.82 07/07/2021   CALCIUM 10.2 07/07/2021   EGFR 83 07/07/2021   GFRNONAA 84 04/10/2019   Lab Results  Component Value Date   CHOL 192 07/07/2021   HDL 52 07/07/2021   LDLCALC 123 (H) 07/07/2021   TRIG 93 07/07/2021   CHOLHDL 3.0 10/23/2015   Lab Results  Component Value Date   TSH 0.641 10/19/2018   No results found for: HGBA1C Lab Results  Component Value Date   WBC 6.4 10/19/2018   HGB 14.4 10/19/2018   HCT 41.2 10/19/2018   MCV 90 10/19/2018   PLT 383 10/19/2018   Lab Results  Component Value Date   ALT 15 07/07/2021   AST 18 07/07/2021   ALKPHOS 95 07/07/2021   BILITOT 0.4 07/07/2021   No results found for: 25OHVITD2, 25OHVITD3, VD25OH   Review of Systems  Constitutional:  Negative for chills and fever.  HENT:  Negative for drooling, ear discharge, ear pain, postnasal drip, rhinorrhea and sore throat.   Respiratory:  Positive for cough. Negative for hemoptysis, shortness of breath and wheezing.    Cardiovascular:  Negative for chest pain, palpitations and leg swelling.  Gastrointestinal:  Negative for abdominal pain, blood in stool, constipation, diarrhea and nausea.  Endocrine: Negative for polydipsia.  Genitourinary:  Negative for dysuria, frequency, hematuria and urgency.  Musculoskeletal:  Negative for back pain, myalgias and neck pain.  Skin:  Negative for rash.  Allergic/Immunologic: Negative for environmental allergies.  Neurological:  Positive for headaches. Negative for dizziness.  Hematological:  Does not bruise/bleed easily.  Psychiatric/Behavioral:  Negative for suicidal ideas. The patient is not nervous/anxious.    Patient Active Problem List   Diagnosis Date Noted   Persistent cough 04/10/2019    Allergies  Allergen Reactions   Penicillins     Past Surgical History:  Procedure Laterality Date   COLONOSCOPY  2015   polyps/ repeat in 2 yrs- Dr Allen Norris   TONSILLECTOMY      Social History   Tobacco Use   Smoking status: Never   Smokeless tobacco: Never  Vaping Use   Vaping Use: Never used  Substance Use Topics   Alcohol use: Yes    Alcohol/week: 0.0 standard drinks   Drug use: No     Medication list has been reviewed and updated.  Current Meds  Medication Sig   cetirizine-pseudoephedrine (ZYRTEC-D) 5-120 MG tablet Take 1 tablet by mouth 2 (two) times daily.   citalopram (CELEXA) 40 MG tablet TAKE 1 TABLET BY MOUTH DAILY   desoximetasone (TOPICORT) 0.25 %  cream Apply bid prn   hydrocortisone (ANUSOL-HC) 2.5 % rectal cream Place 1 application rectally 2 (two) times daily.   meclizine (ANTIVERT) 25 MG tablet Take 1 tablet (25 mg total) by mouth 3 (three) times daily as needed for dizziness.   meloxicam (MOBIC) 7.5 MG tablet Take 1 tablet (7.5 mg total) by mouth daily.   montelukast (SINGULAIR) 10 MG tablet TAKE 1 TABLET BY MOUTH EVERY EVENING   nystatin-triamcinolone (MYCOLOG II) cream Apply topically 2 (two) times daily.   pantoprazole (PROTONIX) 40  MG tablet TAKE 1 TABLET BY MOUTH EVERY DAY       11/03/2021   11:25 AM 07/07/2021    8:22 AM 12/25/2020    8:02 AM 02/21/2020    8:06 AM  GAD 7 : Generalized Anxiety Score  Nervous, Anxious, on Edge 0 0 0 0  Control/stop worrying 0 0 0 0  Worry too much - different things 0 0 0 0  Trouble relaxing 1 0 0 0  Restless 0 0 0 0  Easily annoyed or irritable 0 0 0 0  Afraid - awful might happen 0 0 0 0  Total GAD 7 Score 1 0 0 0  Anxiety Difficulty Not difficult at all Not difficult at all         11/03/2021   11:25 AM  Depression screen PHQ 2/9  Decreased Interest 1  Down, Depressed, Hopeless 0  PHQ - 2 Score 1  Altered sleeping 2  Tired, decreased energy 1  Change in appetite 0  Feeling bad or failure about yourself  0  Trouble concentrating 1  Moving slowly or fidgety/restless 0  Suicidal thoughts 0  PHQ-9 Score 5  Difficult doing work/chores Somewhat difficult    BP Readings from Last 3 Encounters:  11/03/21 124/68  07/07/21 100/62  12/25/20 130/80    Physical Exam Vitals and nursing note reviewed. Exam conducted with a chaperone present.  Constitutional:      General: She is not in acute distress.    Appearance: She is not diaphoretic.  HENT:     Head: Normocephalic and atraumatic.     Right Ear: External ear normal. A middle ear effusion is present.     Left Ear: External ear normal. A middle ear effusion is present.     Nose:     Right Turbinates: Swollen.     Left Turbinates: Swollen.     Right Sinus: Maxillary sinus tenderness present. No frontal sinus tenderness.     Left Sinus: Maxillary sinus tenderness present. No frontal sinus tenderness.     Mouth/Throat:     Lips: Pink.     Mouth: Mucous membranes are moist.     Pharynx: No posterior oropharyngeal erythema.  Eyes:     General:        Right eye: No discharge.        Left eye: No discharge.     Conjunctiva/sclera: Conjunctivae normal.     Right eye: Right conjunctiva is not injected.     Left  eye: Left conjunctiva is not injected.     Pupils: Pupils are equal, round, and reactive to light.     Comments: Ecchymosis right inner orbit  Neck:     Thyroid: No thyromegaly.     Vascular: Normal carotid pulses. No carotid bruit, hepatojugular reflux or JVD.  Cardiovascular:     Rate and Rhythm: Normal rate and regular rhythm.     Heart sounds: Normal heart sounds, S1 normal and S2 normal.  No murmur heard. No systolic murmur is present.  No diastolic murmur is present.    No friction rub. No gallop. No S3 or S4 sounds.  Pulmonary:     Effort: Pulmonary effort is normal.     Breath sounds: Normal breath sounds. No decreased breath sounds, wheezing, rhonchi or rales.  Abdominal:     General: Bowel sounds are normal.     Palpations: Abdomen is soft. There is no mass.     Tenderness: There is no abdominal tenderness. There is no guarding.  Musculoskeletal:        General: Normal range of motion.     Cervical back: Full passive range of motion without pain, normal range of motion and neck supple.  Lymphadenopathy:     Cervical: No cervical adenopathy.  Skin:    General: Skin is warm and dry.     Findings: Ecchymosis present.  Neurological:     Mental Status: She is alert.    Wt Readings from Last 3 Encounters:  11/03/21 157 lb (71.2 kg)  07/07/21 152 lb (68.9 kg)  12/25/20 157 lb (71.2 kg)    BP 124/68   Pulse 72   Ht 5' 5"  (1.651 m)   Wt 157 lb (71.2 kg)   BMI 26.13 kg/m   Assessment and Plan:  1. Acute maxillary sinusitis, recurrence not specified New onset.  Persistent.  Stable.  Although has not been protected there is tenderness over the maxillary sinuses bilaterally.  We will treat with azithromycin 250 mg 2 today followed by 1 a day for 4 days.  2. Persistent cough New onset.  Episodic.  I think she has cough to the point that she had an ecchymosis formation on the inner aspect of the right orbit.  Patient also has allergic shiners.  This could be also be  suggestive of an upper respiratory congestion although patient has not noticed this as much.  We will treat the cough at night with Robitussin-AC teaspoon Q6 as needed with encouragement to use Mucinex DM during the day.

## 2021-11-26 ENCOUNTER — Other Ambulatory Visit: Payer: Self-pay | Admitting: Family Medicine

## 2021-11-26 DIAGNOSIS — J301 Allergic rhinitis due to pollen: Secondary | ICD-10-CM

## 2022-01-04 ENCOUNTER — Ambulatory Visit
Admission: RE | Admit: 2022-01-04 | Discharge: 2022-01-04 | Disposition: A | Discharge status: Home / Self Care | Payer: BC Managed Care – PPO | Source: Ambulatory Visit | Attending: Family Medicine | Admitting: Family Medicine

## 2022-01-04 ENCOUNTER — Ambulatory Visit (INDEPENDENT_AMBULATORY_CARE_PROVIDER_SITE_OTHER): Payer: BC Managed Care – PPO | Admitting: Family Medicine

## 2022-01-04 ENCOUNTER — Ambulatory Visit
Admission: RE | Admit: 2022-01-04 | Discharge: 2022-01-04 | Disposition: A | Discharge status: Home / Self Care | Payer: BC Managed Care – PPO | Attending: Family Medicine | Admitting: Family Medicine

## 2022-01-04 ENCOUNTER — Encounter: Payer: Self-pay | Admitting: Family Medicine

## 2022-01-04 VITALS — BP 116/80 | HR 80 | Ht 65.0 in | Wt 155.0 lb

## 2022-01-04 DIAGNOSIS — J301 Allergic rhinitis due to pollen: Secondary | ICD-10-CM | POA: Diagnosis not present

## 2022-01-04 DIAGNOSIS — K219 Gastro-esophageal reflux disease without esophagitis: Secondary | ICD-10-CM | POA: Diagnosis not present

## 2022-01-04 DIAGNOSIS — M779 Enthesopathy, unspecified: Secondary | ICD-10-CM

## 2022-01-04 DIAGNOSIS — M1811 Unilateral primary osteoarthritis of first carpometacarpal joint, right hand: Secondary | ICD-10-CM | POA: Diagnosis not present

## 2022-01-04 DIAGNOSIS — Z1211 Encounter for screening for malignant neoplasm of colon: Secondary | ICD-10-CM

## 2022-01-04 DIAGNOSIS — F324 Major depressive disorder, single episode, in partial remission: Secondary | ICD-10-CM | POA: Diagnosis not present

## 2022-01-04 MED ORDER — PANTOPRAZOLE SODIUM 40 MG PO TBEC: DELAYED_RELEASE_TABLET | ORAL | 0 refills | Status: DC

## 2022-01-04 MED ORDER — CETIRIZINE-PSEUDOEPHEDRINE ER 5-120 MG PO TB12: 1.0000 | ORAL_TABLET | Freq: Two times a day (BID) | ORAL | 1 refills | Status: DC

## 2022-01-04 MED ORDER — CITALOPRAM HYDROBROMIDE 40 MG PO TABS: 40.0000 mg | ORAL_TABLET | Freq: Every day | ORAL | 1 refills | Status: DC

## 2022-01-04 MED ORDER — MONTELUKAST SODIUM 10 MG PO TABS
10.0000 mg | ORAL_TABLET | Freq: Every evening | ORAL | 0 refills | Status: DC
Start: 2022-01-04 — End: 2022-02-15

## 2022-01-04 NOTE — Progress Notes (Signed)
Date:  01/04/2022   Name:  Debbie Bell   DOB:  1960-06-11   MRN:  [id number]   Chief Complaint: Gastroesophageal Reflux, Allergic Rhinitis , Depression, and Hand Pain (R) hand- hurts to pick something up- radiates up arm)  Gastroesophageal Reflux She complains of coughing and heartburn. She reports no abdominal pain, no chest pain, no choking, no dysphagia, no nausea, no sore throat or no wheezing. This is a chronic problem. The problem has been gradually improving. The symptoms are aggravated by certain foods. Associated symptoms include fatigue. She has tried a PPI for the symptoms. The treatment provided moderate relief.  Depression        This is a chronic problem.  The current episode started more than 1 year ago.   The problem occurs daily.  The problem has been gradually improving since onset.  Associated symptoms include fatigue, insomnia and decreased interest.  Past treatments include SSRIs - Selective serotonin reuptake inhibitors.  Compliance with treatment is good. Hand Pain  The incident occurred more than 1 week ago (6 weeks). There was no injury mechanism. The pain is present in the right forearm and right wrist. The quality of the pain is described as aching. The pain radiates to the right arm. The pain is at a severity of 4/10. The pain is moderate. Pain course: burning. Pertinent negatives include no chest pain, muscle weakness, numbness or tingling.  URI  This is a chronic problem. Episode onset: for allergies. Associated symptoms include coughing. Pertinent negatives include no abdominal pain, chest pain, dysuria, ear pain, nausea, rhinorrhea, sinus pain, sneezing, sore throat or wheezing. She has tried antihistamine (singulair) for the symptoms. The treatment provided moderate relief.    Lab Results  Component Value Date   NA 143 07/07/2021   K 4.7 07/07/2021   CO2 23 07/07/2021   GLUCOSE 84 07/07/2021   BUN 9 07/07/2021   CREATININE 0.82 07/07/2021   CALCIUM 10.2  07/07/2021   EGFR 83 07/07/2021   GFRNONAA 84 04/10/2019   Lab Results  Component Value Date   CHOL 192 07/07/2021   HDL 52 07/07/2021   LDLCALC 123 (H) 07/07/2021   TRIG 93 07/07/2021   CHOLHDL 3.0 10/23/2015   Lab Results  Component Value Date   TSH 0.641 10/19/2018   No results found for: "HGBA1C" Lab Results  Component Value Date   WBC 6.4 10/19/2018   HGB 14.4 10/19/2018   HCT 41.2 10/19/2018   MCV 90 10/19/2018   PLT 383 10/19/2018   Lab Results  Component Value Date   ALT 15 07/07/2021   AST 18 07/07/2021   ALKPHOS 95 07/07/2021   BILITOT 0.4 07/07/2021   No results found for: "25OHVITD2", "25OHVITD3", "VD25OH"   Review of Systems  Constitutional:  Positive for fatigue. Negative for fever.  HENT:  Negative for ear pain, nosebleeds, postnasal drip, rhinorrhea, sinus pressure, sinus pain, sneezing and sore throat.   Eyes:  Positive for itching.  Respiratory:  Positive for cough. Negative for choking and wheezing.   Cardiovascular:  Negative for chest pain.  Gastrointestinal:  Positive for heartburn. Negative for abdominal pain, blood in stool, dysphagia and nausea.  Genitourinary:  Negative for dysuria.  Musculoskeletal:  Positive for arthralgias.  Neurological:  Negative for tingling and numbness.  Psychiatric/Behavioral:  Positive for depression. The patient has insomnia.     Patient Active Problem List   Diagnosis Date Noted   Persistent cough 04/10/2019    Allergies  Allergen Reactions  Penicillins     Past Surgical History:  Procedure Laterality Date   COLONOSCOPY  2015   polyps/ repeat in 2 yrs- Dr Allen Norris   TONSILLECTOMY      Social History   Tobacco Use   Smoking status: Never   Smokeless tobacco: Never  Vaping Use   Vaping Use: Never used  Substance Use Topics   Alcohol use: Yes    Alcohol/week: 0.0 standard drinks of alcohol   Drug use: No     Medication list has been reviewed and updated.  Current Meds  Medication Sig    cetirizine-pseudoephedrine (ZYRTEC-D) 5-120 MG tablet Take 1 tablet by mouth 2 (two) times daily.   citalopram (CELEXA) 40 MG tablet TAKE 1 TABLET BY MOUTH DAILY   desoximetasone (TOPICORT) 0.25 % cream Apply bid prn   hydrocortisone (ANUSOL-HC) 2.5 % rectal cream Place 1 application rectally 2 (two) times daily.   meclizine (ANTIVERT) 25 MG tablet Take 1 tablet (25 mg total) by mouth 3 (three) times daily as needed for dizziness.   meloxicam (MOBIC) 7.5 MG tablet Take 1 tablet (7.5 mg total) by mouth daily.   montelukast (SINGULAIR) 10 MG tablet TAKE 1 TABLET BY MOUTH EVERY EVENING   nystatin-triamcinolone (MYCOLOG II) cream Apply topically 2 (two) times daily.   pantoprazole (PROTONIX) 40 MG tablet TAKE 1 TABLET BY MOUTH EVERY DAY       01/04/2022    9:42 AM 11/03/2021   11:25 AM 07/07/2021    8:22 AM 12/25/2020    8:02 AM  GAD 7 : Generalized Anxiety Score  Nervous, Anxious, on Edge 0 0 0 0  Control/stop worrying 0 0 0 0  Worry too much - different things 0 0 0 0  Trouble relaxing 0 1 0 0  Restless 0 0 0 0  Easily annoyed or irritable 0 0 0 0  Afraid - awful might happen 0 0 0 0  Total GAD 7 Score 0 1 0 0  Anxiety Difficulty Not difficult at all Not difficult at all Not difficult at all        01/04/2022    9:41 AM 11/03/2021   11:25 AM 07/07/2021    8:22 AM  Depression screen PHQ 2/9  Decreased Interest 2 1 0  Down, Depressed, Hopeless 0 0 0  PHQ - 2 Score 2 1 0  Altered sleeping 1 2 0  Tired, decreased energy 2 1 0  Change in appetite 0 0 0  Feeling bad or failure about yourself  0 0 0  Trouble concentrating 1 1 0  Moving slowly or fidgety/restless 0 0 0  Suicidal thoughts 0 0 0  PHQ-9 Score 6 5 0  Difficult doing work/chores Somewhat difficult Somewhat difficult Not difficult at all    BP Readings from Last 3 Encounters:  01/04/22 116/80  11/03/21 124/68  07/07/21 100/62    Physical Exam Vitals and nursing note reviewed. Exam conducted with a chaperone present.   Constitutional:      General: She is not in acute distress.    Appearance: She is not diaphoretic.  HENT:     Head: Normocephalic and atraumatic.     Right Ear: Tympanic membrane and external ear normal.     Left Ear: Tympanic membrane and external ear normal.     Nose: Nose normal.     Mouth/Throat:     Mouth: Mucous membranes are moist.  Eyes:     General:        Right eye:  No discharge.        Left eye: No discharge.     Conjunctiva/sclera: Conjunctivae normal.     Pupils: Pupils are equal, round, and reactive to light.  Neck:     Thyroid: No thyromegaly.     Vascular: No JVD.  Cardiovascular:     Rate and Rhythm: Normal rate and regular rhythm.     Heart sounds: Normal heart sounds. No murmur heard.    No friction rub. No gallop.  Pulmonary:     Effort: Pulmonary effort is normal.     Breath sounds: Normal breath sounds.  Abdominal:     General: Bowel sounds are normal.     Palpations: Abdomen is soft. There is no mass.     Tenderness: There is no abdominal tenderness. There is no guarding.  Musculoskeletal:     Right wrist: Tenderness present. Decreased range of motion.     Cervical back: Normal range of motion and neck supple.  Lymphadenopathy:     Cervical: No cervical adenopathy.  Skin:    General: Skin is warm and dry.  Neurological:     Mental Status: She is alert.     Deep Tendon Reflexes: Reflexes are normal and symmetric.     Wt Readings from Last 3 Encounters:  01/04/22 155 lb (70.3 kg)  11/03/21 157 lb (71.2 kg)  07/07/21 152 lb (68.9 kg)    BP 116/80   Pulse 80   Ht 5' 5"  (1.651 m)   Wt 155 lb (70.3 kg)   BMI 25.79 kg/m   Assessment and Plan:  1. Seasonal allergic rhinitis due to pollen Chronic.  Controlled.  Stable.  Continue Zyrtec-D and Singulair 10 mg once a day. - cetirizine-pseudoephedrine (ZYRTEC-D) 5-120 MG tablet; Take 1 tablet by mouth 2 (two) times daily.  Dispense: 180 tablet; Refill: 1 - montelukast (SINGULAIR) 10 MG tablet;  Take 1 tablet (10 mg total) by mouth every evening.  Dispense: 90 tablet; Refill: 0  2. Major depressive disorder in partial remission, unspecified whether recurrent (HCC) Chronic.  Controlled.  Stable.  PHQ of 6 and gad score 0.  Continue citalopram 40 mg once a day. - citalopram (CELEXA) 40 MG tablet; Take 1 tablet (40 mg total) by mouth daily.  Dispense: 90 tablet; Refill: 1  3. Gastroesophageal reflux disease, unspecified whether esophagitis present Chronic.  Controlled.  Stable.  Continue pantoprazole 40 mg once a day. - pantoprazole (PROTONIX) 40 MG tablet; TAKE 1 TABLET BY MOUTH EVERY DAY  Dispense: 90 tablet; Refill: 0  4. Colon cancer screening Discussed with patient and referral made to gastroenterology. - Ambulatory referral to Gastroenterology  5. Tendonitis Tenderness over the abducting tendons and extensor Pollick is longus.  Consistent with tendinitis possibly Decore vein of nature.  We will refer to sports medicine and obtain an x-ray of the hand prior. - DG Hand Complete Right

## 2022-01-05 ENCOUNTER — Telehealth: Payer: Self-pay | Admitting: Gastroenterology

## 2022-01-05 ENCOUNTER — Encounter: Payer: Self-pay | Admitting: Family Medicine

## 2022-01-05 ENCOUNTER — Ambulatory Visit (INDEPENDENT_AMBULATORY_CARE_PROVIDER_SITE_OTHER): Payer: BC Managed Care – PPO | Admitting: Family Medicine

## 2022-01-05 ENCOUNTER — Other Ambulatory Visit: Payer: Self-pay

## 2022-01-05 ENCOUNTER — Telehealth: Payer: Self-pay

## 2022-01-05 VITALS — BP 118/80 | HR 88 | Ht 65.0 in | Wt 155.0 lb

## 2022-01-05 DIAGNOSIS — Z1211 Encounter for screening for malignant neoplasm of colon: Secondary | ICD-10-CM

## 2022-01-05 DIAGNOSIS — M1811 Unilateral primary osteoarthritis of first carpometacarpal joint, right hand: Secondary | ICD-10-CM | POA: Diagnosis not present

## 2022-01-05 MED ORDER — MELOXICAM 15 MG PO TABS: 15.0000 mg | ORAL_TABLET | Freq: Every day | ORAL | 0 refills | Status: DC

## 2022-01-05 MED ORDER — NA SULFATE-K SULFATE-MG SULF 17.5-3.13-1.6 GM/177ML PO SOLN: 1.0000 | Freq: Once | ORAL | 0 refills | Status: AC

## 2022-01-05 NOTE — Telephone Encounter (Signed)
Patient calling to schedule colonoscopy. Requests call back.  

## 2022-01-05 NOTE — Assessment & Plan Note (Addendum)
Patient presents with roughly 1 year history of progressively worsening right base of thumb pain, significantly worsened over the past 3 months.  Denies any specific trauma or change in activity during that timeframe but does recall roughly 4 years prior, while working with tools having a sharp severe pain at the same location which subsided with time and rest.  Examination today reveals focal tenderness at the first [facility] of the right hand, there is focal sharp pain with basal joint grind test and subtle crepitus, no ligamentous laxity noted, nontender otherwise, provocative testing otherwise benign.  Given her x-rays showing osteoarthritis and osteophyte formation at the first [facility], examination findings, overall clinical presentation is acute on chronic exacerbation of for [facility] osteoarthritis.  This was reviewed with the patient as well as treatments, will trial semirigid thumb spica brace, meloxicam regimen, and home exercises.  Plan for follow-up in 4 weeks, for suboptimal progress can consider further medication changes, ultrasound-guided intra-articular cortisone injection.

## 2022-01-05 NOTE — Patient Instructions (Signed)
-   Start meloxicam 15 mg daily x2 weeks (take with food) - After 2 weeks dose once daily on as-needed basis for thumb pain - Use brace consistently throughout the day while active, okay to remove for bathing, sleeping, driving, eating - Start home exercises after 1 week and continue until follow-up - Return for follow-up in 4 weeks, contact for questions between now and then

## 2022-01-05 NOTE — Progress Notes (Signed)
Primary Care / Sports Medicine Office Visit  Patient Information:  Patient ID: Debbie Bell, female DOB: 1960-12-10 Age: 61 y.o. MRN: [id number]   Debbie Bell is a pleasant 61 y.o. female presenting with the following:  Chief Complaint  Patient presents with   Hand Pain    Hand Pain (R) hand- hurts to pick something up- aches radiates up arm for 3 months, Xray done. Meloxicam helped in beginning but not now.     Vitals:   01/05/22 0843  BP: 118/80  Pulse: 88  SpO2: 98%   Vitals:   01/05/22 0843  Weight: 155 lb (70.3 kg)  Height: 5\' 5"  (1.651 m)   Body mass index is 25.79 kg/m.  DG Hand Complete Right  Result Date: 01/04/2022 CLINICAL DATA:  Pain x3 months with recent worsening EXAM: RIGHT HAND - COMPLETE 3+ VIEW COMPARISON:  Right thumb radiographs done on 08/06/2010 FINDINGS: No fracture or dislocation is seen. There are no focal lytic lesions. Degenerative changes with bony spurs are noted in first carpometacarpal joint. 2 mm smoothly marginated calcification adjacent to the base of first metacarpal may be residual from previous injury or degenerative arthritis. Minimal bony spurs are seen in the second metacarpophalangeal joint. IMPRESSION: No recent fracture or dislocation is seen. Degenerative changes are noted in first carpometacarpal joint and second metacarpophalangeal joint with bony spurs. Electronically Signed   By: 10/06/2010 M.D.   On: 01/04/2022 15:43     Independent interpretation of notes and tests performed by another provider:   Independent interpretation of right hand x-ray dated 01/04/2022 reveals focal mild degenerative changes at the first [facility] which may be consistent with osteophyte, loose body, no acute osseous processes noted.  Procedures performed:   None  Pertinent History, Exam, Impression, and Recommendations:   Problem List Items Addressed This Visit       Musculoskeletal and Integument   Primary osteoarthritis of first  carpometacarpal joint of right hand - Primary    Patient presents with roughly 1 year history of progressively worsening right base of thumb pain, significantly worsened over the past 3 months.  Denies any specific trauma or change in activity during that timeframe but does recall roughly 4 years prior, while working with tools having a sharp severe pain at the same location which subsided with time and rest.  Examination today reveals focal tenderness at the first [facility] of the right hand, there is focal sharp pain with basal joint grind test and subtle crepitus, no ligamentous laxity noted, nontender otherwise, provocative testing otherwise benign.  Given her x-rays showing osteoarthritis and osteophyte formation at the first [facility], examination findings, overall clinical presentation is acute on chronic exacerbation of for [facility] osteoarthritis.  This was reviewed with the patient as well as treatments, will trial semirigid thumb spica brace, meloxicam regimen, and home exercises.  Plan for follow-up in 4 weeks, for suboptimal progress can consider further medication changes, ultrasound-guided intra-articular cortisone injection.      Relevant Medications   meloxicam (MOBIC) 15 MG tablet     Orders & Medications Meds ordered this encounter  Medications   meloxicam (MOBIC) 15 MG tablet    Sig: Take 1 tablet (15 mg total) by mouth daily.    Dispense:  30 tablet    Refill:  0   No orders of the defined types were placed in this encounter.    Return in about 4 weeks (around 02/02/2022).     02/04/2022, MD  [place]

## 2022-01-05 NOTE — Telephone Encounter (Signed)
Gastroenterology Pre-Procedure Review  Request Date: 02/12/22 Requesting Physician: Dr. Servando Snare  PATIENT REVIEW QUESTIONS: The patient responded to the following health history questions as indicated:    1. Are you having any GI issues? no 2. Do you have a personal history of Polyps? no 3. Do you have a family history of Colon Cancer or Polyps? yes (mother and father colon polyps) 4. Diabetes Mellitus? no 5. Joint replacements in the past 12 months?no 6. Major health problems in the past 3 months?no 7. Any artificial heart valves, MVP, or defibrillator?no    MEDICATIONS & ALLERGIES:    Patient reports the following regarding taking any anticoagulation/antiplatelet therapy:   Plavix, Coumadin, Eliquis, Xarelto, Lovenox, Pradaxa, Brilinta, or Effient? no Aspirin? no  Patient confirms/reports the following medications:  Current Outpatient Medications  Medication Sig Dispense Refill   cetirizine-pseudoephedrine (ZYRTEC-D) 5-120 MG tablet Take 1 tablet by mouth 2 (two) times daily. 180 tablet 1   citalopram (CELEXA) 40 MG tablet Take 1 tablet (40 mg total) by mouth daily. 90 tablet 1   desoximetasone (TOPICORT) 0.25 % cream Apply bid prn 30 g 1   hydrocortisone (ANUSOL-HC) 2.5 % rectal cream Place 1 application rectally 2 (two) times daily. 30 g 1   meclizine (ANTIVERT) 25 MG tablet Take 1 tablet (25 mg total) by mouth 3 (three) times daily as needed for dizziness. 30 tablet 1   meloxicam (MOBIC) 15 MG tablet Take 1 tablet (15 mg total) by mouth daily. 30 tablet 0   montelukast (SINGULAIR) 10 MG tablet Take 1 tablet (10 mg total) by mouth every evening. 90 tablet 0   nystatin-triamcinolone (MYCOLOG II) cream Apply topically 2 (two) times daily. 30 g 1   pantoprazole (PROTONIX) 40 MG tablet TAKE 1 TABLET BY MOUTH EVERY DAY 90 tablet 0   No current facility-administered medications for this visit.    Patient confirms/reports the following allergies:  Allergies  Allergen Reactions    Penicillins     No orders of the defined types were placed in this encounter.   AUTHORIZATION INFORMATION Primary Insurance: 1D#: Group #:  Secondary Insurance: 1D#: Group #:  SCHEDULE INFORMATION: Date: 02/12/22 Time: Location: [facility]

## 2022-01-13 ENCOUNTER — Other Ambulatory Visit: Payer: Self-pay | Admitting: Family Medicine

## 2022-01-13 DIAGNOSIS — L259 Unspecified contact dermatitis, unspecified cause: Secondary | ICD-10-CM

## 2022-01-13 DIAGNOSIS — B354 Tinea corporis: Secondary | ICD-10-CM

## 2022-02-02 ENCOUNTER — Encounter: Payer: Self-pay | Admitting: Family Medicine

## 2022-02-02 ENCOUNTER — Ambulatory Visit (INDEPENDENT_AMBULATORY_CARE_PROVIDER_SITE_OTHER): Payer: BC Managed Care – PPO | Admitting: Family Medicine

## 2022-02-02 ENCOUNTER — Inpatient Hospital Stay (INDEPENDENT_AMBULATORY_CARE_PROVIDER_SITE_OTHER): Payer: BC Managed Care – PPO | Admitting: Radiology

## 2022-02-02 VITALS — BP 126/86 | HR 72 | Ht 65.0 in | Wt 156.4 lb

## 2022-02-02 DIAGNOSIS — M1811 Unilateral primary osteoarthritis of first carpometacarpal joint, right hand: Secondary | ICD-10-CM | POA: Diagnosis not present

## 2022-02-02 MED ORDER — TRIAMCINOLONE ACETONIDE 40 MG/ML IJ SUSP
40.0000 mg | Freq: Once | INTRAMUSCULAR | Status: AC
  Administered 2022-02-02: 40 mg via INTRAMUSCULAR

## 2022-02-02 MED ORDER — MELOXICAM 15 MG PO TABS: 15.0000 mg | ORAL_TABLET | Freq: Every day | ORAL | 0 refills | Status: DC | PRN

## 2022-02-02 NOTE — Patient Instructions (Signed)
You have just been given a cortisone injection to reduce pain and inflammation. After the injection you may notice immediate relief of pain as a result of the Lidocaine. It is important to rest the area of the injection for 24 to 48 hours after the injection. There is a possibility of some temporary increased discomfort and swelling for up to 72 hours until the cortisone begins to work. If you do have pain, simply rest the joint and use ice. If you can tolerate over the counter medications, you can try Tylenol for added relief per package instructions. - As above, relative rest for 2 days, utilize brace - After 2 days, gradual return to full unrestricted activity - Once symptoms respond to cortisone, start and advance home exercises - Can utilize meloxicam on as-needed basis for any breakthrough pain - Contact for questions and follow-up as needed

## 2022-02-02 NOTE — Addendum Note (Signed)
Addended by: Bronson Ing on: 02/02/2022 11:26 AM   Modules accepted: Orders

## 2022-02-02 NOTE — Assessment & Plan Note (Signed)
Patient has noted interim response and slight improvement following thumb spica immobilization, scheduled meloxicam.  She also brings up minor stomach discomfort with the regular meloxicam usage, overall controlled.  That being said, she is still symptomatic without full relief with intermittent episodes of worsening pain.  Examination does show persistent tenderness to the first CMC, positive grind test, otherwise benign.  Given her clinical course, reported symptoms, she did elect to proceed with ultrasound-guided right first [facility] corticosteroid injection.  Post care reviewed, will utilize thumb spica for 2 days then as needed, can transition meloxicam to as needed dose, and I have encouraged her to advance home exercise.  She can contact us for any recalcitrant symptoms and otherwise follow-up as needed.

## 2022-02-02 NOTE — Progress Notes (Signed)
Primary Care / Sports Medicine Office Visit  Patient Information:  Patient ID: Debbie Bell, female DOB: [birth year] Age: 61 y.o. MRN: [id number]   Debbie Bell is a pleasant 61 y.o. female presenting with the following:  Chief Complaint  Patient presents with   osteoarthritis of first carpometacarpal joint of right hand    States she has got a little better, but has to wear brace all the time    Vitals:   02/02/22 0837  BP: 126/86  Pulse: 72  SpO2: 98%   Vitals:   02/02/22 0837  Weight: 156 lb 6.4 oz (70.9 kg)  Height: 5\' 5"  (1.651 m)   Body mass index is 26.03 kg/m.  DG Hand Complete Right  Result Date: 01/04/2022 CLINICAL DATA:  Pain x3 months with recent worsening EXAM: RIGHT HAND - COMPLETE 3+ VIEW COMPARISON:  Right thumb radiographs done on 08/06/2010 FINDINGS: No fracture or dislocation is seen. There are no focal lytic lesions. Degenerative changes with bony spurs are noted in first carpometacarpal joint. 2 mm smoothly marginated calcification adjacent to the base of first metacarpal may be residual from previous injury or degenerative arthritis. Minimal bony spurs are seen in the second metacarpophalangeal joint. IMPRESSION: No recent fracture or dislocation is seen. Degenerative changes are noted in first carpometacarpal joint and second metacarpophalangeal joint with bony spurs. Electronically Signed   By: 10/06/2010 M.D.   On: 01/04/2022 15:43     Independent interpretation of notes and tests performed by another provider:   None  Procedures performed:   Procedure:  Injection of right first [facility] under ultrasound guidance. Ultrasound guidance utilized for out of plane injection, visualize CMC joint with dynamic motion, cortical roughening consistent with osteoarthritis noted Samsung HS60 device utilized with permanent recording / reporting. Verbal informed consent obtained and verified. Skin prepped in a sterile fashion. Ethyl chloride for  topical local analgesia.  Completed without difficulty and tolerated well. Medication: triamcinolone acetonide 40 mg/mL suspension for injection 0.5 mL total and 0.5 mL lidocaine 1% without epinephrine utilized for needle placement anesthetic Advised to contact for fevers/chills, erythema, induration, drainage, or persistent bleeding.   Pertinent History, Exam, Impression, and Recommendations:   Problem List Items Addressed This Visit       Musculoskeletal and Integument   Primary osteoarthritis of first carpometacarpal joint of right hand - Primary    Patient has noted interim response and slight improvement following thumb spica immobilization, scheduled meloxicam.  She also brings up minor stomach discomfort with the regular meloxicam usage, overall controlled.  That being said, she is still symptomatic without full relief with intermittent episodes of worsening pain.  Examination does show persistent tenderness to the first CMC, positive grind test, otherwise benign.  Given her clinical course, reported symptoms, she did elect to proceed with ultrasound-guided right first [facility] corticosteroid injection.  Post care reviewed, will utilize thumb spica for 2 days then as needed, can transition meloxicam to as needed dose, and I have encouraged her to advance home exercise.  She can contact [facility] for any recalcitrant symptoms and otherwise follow-up as needed.      Relevant Medications   meloxicam (MOBIC) 15 MG tablet   Other Relevant Orders   [place] LIMITED JOINT SPACE STRUCTURES UP RIGHT     Orders & Medications Meds ordered this encounter  Medications   meloxicam (MOBIC) 15 MG tablet    Sig: Take 1 tablet (15 mg total) by mouth daily as needed for pain.  Dispense:  30 tablet    Refill:  0   Orders Placed This Encounter  Procedures   [place] LIMITED JOINT SPACE STRUCTURES UP RIGHT     No follow-ups on file.     Jerrol Banana, MD   Primary Care Sports Medicine [facility]

## 2022-02-05 ENCOUNTER — Encounter: Payer: Self-pay | Admitting: Gastroenterology

## 2022-02-12 ENCOUNTER — Ambulatory Visit: Payer: BC Managed Care – PPO | Admitting: Anesthesiology

## 2022-02-12 ENCOUNTER — Encounter
Admission: RE | Disposition: A | Discharge status: Home / Self Care | Payer: Self-pay | Source: Ambulatory Visit | Attending: Gastroenterology

## 2022-02-12 ENCOUNTER — Other Ambulatory Visit: Payer: Self-pay

## 2022-02-12 ENCOUNTER — Encounter: Payer: Self-pay | Admitting: Gastroenterology

## 2022-02-12 ENCOUNTER — Ambulatory Visit
Admission: RE | Admit: 2022-02-12 | Discharge: 2022-02-12 | Disposition: A | Discharge status: Home / Self Care | Payer: BC Managed Care – PPO | Source: Ambulatory Visit | Attending: Gastroenterology | Admitting: Gastroenterology

## 2022-02-12 DIAGNOSIS — K219 Gastro-esophageal reflux disease without esophagitis: Secondary | ICD-10-CM | POA: Diagnosis not present

## 2022-02-12 DIAGNOSIS — Z1211 Encounter for screening for malignant neoplasm of colon: Secondary | ICD-10-CM | POA: Diagnosis not present

## 2022-02-12 DIAGNOSIS — K640 First degree hemorrhoids: Secondary | ICD-10-CM | POA: Insufficient documentation

## 2022-02-12 DIAGNOSIS — F32A Depression, unspecified: Secondary | ICD-10-CM | POA: Diagnosis not present

## 2022-02-12 HISTORY — DX: Unspecified osteoarthritis, unspecified site: M19.90

## 2022-02-12 HISTORY — PX: COLONOSCOPY WITH PROPOFOL: SHX5780

## 2022-02-12 SURGERY — COLONOSCOPY WITH PROPOFOL
Anesthesia: General | Site: Rectum

## 2022-02-12 MED ORDER — PROPOFOL 10 MG/ML IV BOLUS
INTRAVENOUS | Status: DC | PRN
  Administered 2022-02-12 (×4): 50 mg via INTRAVENOUS

## 2022-02-12 MED ORDER — STERILE WATER FOR IRRIGATION IR SOLN
Status: DC | PRN
  Administered 2022-02-12: 60 mL

## 2022-02-12 MED ORDER — STERILE WATER FOR IRRIGATION IR SOLN
Status: DC | PRN
  Administered 2022-02-12: 150 mL

## 2022-02-12 MED ORDER — LACTATED RINGERS IV SOLN: INTRAVENOUS | Status: DC

## 2022-02-12 MED ORDER — SODIUM CHLORIDE 0.9 % IV SOLN: INTRAVENOUS | Status: DC

## 2022-02-12 SURGICAL SUPPLY — 21 items

## 2022-02-12 NOTE — H&P (Signed)
Midge Minium, MD [facility] [address], [address] [place], [place] [address] Phone: [phone] Fax : [phone]  Primary Care Physician:  Duanne Limerick, MD Primary Gastroenterologist:  Dr. Servando Snare  Pre-Procedure History & Physical: HPI:  Debbie Bell is a 61 y.o. female is here for a screening colonoscopy.   Past Medical History:  Diagnosis Date   Allergy    Arthritis    Depression    GERD (gastroesophageal reflux disease)    Hemorrhoid     Past Surgical History:  Procedure Laterality Date   COLONOSCOPY  2015   polyps/ repeat in 2 yrs- Dr Servando Snare   TONSILLECTOMY      Prior to Admission medications   Medication Sig Start Date End Date Taking? Authorizing Provider  cetirizine-pseudoephedrine (ZYRTEC-D) 5-120 MG tablet Take 1 tablet by mouth 2 (two) times daily. 01/04/22  Yes Duanne Limerick, MD  citalopram (CELEXA) 40 MG tablet Take 1 tablet (40 mg total) by mouth daily. 01/04/22  Yes Duanne Limerick, MD  Flaxseed, Linseed, (FLAX SEED OIL PO) Take by mouth daily.   Yes [provider]  meclizine (ANTIVERT) 25 MG tablet Take 1 tablet (25 mg total) by mouth 3 (three) times daily as needed for dizziness. 03/14/20  Yes Duanne Limerick, MD  meloxicam (MOBIC) 15 MG tablet Take 1 tablet (15 mg total) by mouth daily as needed for pain. 02/02/22  Yes Jerrol Banana, MD  montelukast (SINGULAIR) 10 MG tablet Take 1 tablet (10 mg total) by mouth every evening. 01/04/22  Yes Duanne Limerick, MD  pantoprazole (PROTONIX) 40 MG tablet TAKE 1 TABLET BY MOUTH EVERY DAY 01/04/22  Yes Duanne Limerick, MD  desoximetasone (TOPICORT) 0.25 % cream Apply bid prn 02/21/20   Duanne Limerick, MD  hydrocortisone (ANUSOL-HC) 2.5 % rectal cream Place 1 application rectally 2 (two) times daily. 07/07/21   Duanne Limerick, MD  nystatin-triamcinolone [facility] II) cream APPLY TOPICALLY TWICE DAILY 01/13/22   Duanne Limerick, MD    Allergies as of 01/05/2022 - Review Complete 01/05/2022  Allergen Reaction  Noted   Penicillins  10/23/2015    Family History  Problem Relation Age of Onset   Heart disease Father    Cancer Paternal Aunt    Breast cancer Paternal Aunt 84   Stroke Maternal Grandmother     Social History   Socioeconomic History   Marital status: Single    Spouse name: Not on file   Number of children: Not on file   Years of education: Not on file   Highest education level: Not on file  Occupational History   Not on file  Tobacco Use   Smoking status: Never   Smokeless tobacco: Never  Vaping Use   Vaping Use: Never used  Substance and Sexual Activity   Alcohol use: Yes    Comment: occasionally   Drug use: No   Sexual activity: Yes  Other Topics Concern   Not on file  Social History Narrative   Not on file   Social Determinants of Health   Financial Resource Strain: Not on file  Food Insecurity: Not on file  Transportation Needs: Not on file  Physical Activity: Not on file  Stress: Not on file  Social Connections: Not on file  Intimate Partner Violence: Not on file    Review of Systems: See HPI, otherwise negative ROS  Physical Exam: BP 117/74   Temp 97.7 F (36.5 C) (Tympanic)   Resp 11  Ht 5\' 5"  (1.651 m)   Wt 68.8 kg   SpO2 94%   BMI 25.23 kg/m  General:   Alert,  pleasant and cooperative in NAD Head:  Normocephalic and atraumatic. Neck:  Supple; no masses or thyromegaly. Lungs:  Clear throughout to auscultation.    Heart:  Regular rate and rhythm. Abdomen:  Soft, nontender and nondistended. Normal bowel sounds, without guarding, and without rebound.   Neurologic:  Alert and  oriented x4;  grossly normal neurologically.  Impression/Plan: Debbie Bell is now here to undergo a screening colonoscopy.  Risks, benefits, and alternatives regarding colonoscopy have been reviewed with the patient.  Questions have been answered.  All parties agreeable.

## 2022-02-12 NOTE — Op Note (Signed)
[facility] Patient Name: Debbie Bell Procedure Date: 02/12/2022 11:13 AM MRN: [id number] Account #: [id number] Date of Birth: 04/20/1961 Admit Type: Outpatient Age: 61 Room: [facility] OR ROOM 01 Gender: Female Note Status: Finalized Instrument Name: [id number] Procedure:             Colonoscopy Indications:           Screening for colorectal malignant neoplasm Providers:             Midge Minium MD, MD Referring MD:          Duanne Limerick, MD (Referring MD) Medicines:             Propofol per Anesthesia Complications:         No immediate complications. Procedure:             Pre-Anesthesia Assessment:                        - Prior to the procedure, a History and Physical was                         performed, and patient medications and allergies were                         reviewed. The patient's tolerance of previous                         anesthesia was also reviewed. The risks and benefits                         of the procedure and the sedation options and risks                         were discussed with the patient. All questions were                         answered, and informed consent was obtained. Prior                         Anticoagulants: The patient has taken no previous                         anticoagulant or antiplatelet agents. ASA Grade                         Assessment: II - A patient with mild systemic disease.                         After reviewing the risks and benefits, the patient                         was deemed in satisfactory condition to undergo the                         procedure.                        After obtaining informed consent, the colonoscope was  passed under direct vision. Throughout the procedure,                         the patient's blood pressure, pulse, and oxygen                         saturations were monitored continuously. The was                          introduced through the anus and advanced to the the                         cecum, identified by appendiceal orifice and ileocecal                         valve. The colonoscopy was performed without                         difficulty. The patient tolerated the procedure well.                         The quality of the bowel preparation was excellent. Findings:      The perianal and digital rectal examinations were normal.      Non-bleeding internal hemorrhoids were found during retroflexion. The       hemorrhoids were Grade I (internal hemorrhoids that do not prolapse). Impression:            - Non-bleeding internal hemorrhoids.                        - No specimens collected. Recommendation:        - Discharge patient to home.                        - Resume previous diet.                        - Continue present medications.                        - Repeat colonoscopy in 10 years for screening                         purposes. Procedure Code(s):     --- Professional ---                        [phone], Colonoscopy, flexible; diagnostic, including                         collection of specimen(s) by brushing or washing, when                         performed (separate procedure) Diagnosis Code(s):     --- Professional ---                        Z12.11, Encounter for screening for malignant neoplasm                         of colon CPT copyright 2019 American  Medical Association. All rights reserved. The codes documented in this report are preliminary and upon coder review may  be revised to meet current compliance requirements. Midge Minium MD, MD 02/12/2022 11:42:34 AM This report has been signed electronically. Number of Addenda: 0 Note Initiated On: 02/12/2022 11:13 AM Scope Withdrawal Time: 0 hours 8 minutes 25 seconds  Total Procedure Duration: 0 hours 11 minutes 44 seconds  Estimated Blood Loss:  Estimated blood loss: none.      [facility]

## 2022-02-12 NOTE — Anesthesia Postprocedure Evaluation (Signed)
Anesthesia Post Note  Patient: Debbie Bell  Procedure(s) Performed: COLONOSCOPY WITH PROPOFOL (Rectum)     Patient location during evaluation: PACU Anesthesia Type: General Level of consciousness: awake and alert Pain management: pain level controlled Vital Signs Assessment: post-procedure vital signs reviewed and stable Respiratory status: spontaneous breathing, nonlabored ventilation, respiratory function stable and patient connected to nasal cannula oxygen Cardiovascular status: blood pressure returned to baseline and stable Postop Assessment: no apparent nausea or vomiting Anesthetic complications: no   There were no known notable events for this encounter.  Lenard Simmer

## 2022-02-12 NOTE — Anesthesia Preprocedure Evaluation (Signed)
Anesthesia Evaluation  Patient identified by MRN, date of birth, ID band Patient awake    Reviewed: Allergy & Precautions, H&P , NPO status , Patient's Chart, lab work & pertinent test results, reviewed documented beta blocker date and time   History of Anesthesia Complications Negative for: history of anesthetic complications  Airway Mallampati: III  TM Distance: >3 FB Neck ROM: full  Mouth opening: Limited Mouth Opening  Dental  (+) Dental Advidsory Given, Caps, Poor Dentition   Pulmonary neg pulmonary ROS,    Pulmonary exam normal breath sounds clear to auscultation       Cardiovascular Exercise Tolerance: Good negative cardio ROS Normal cardiovascular exam Rhythm:regular Rate:Normal     Neuro/Psych PSYCHIATRIC DISORDERS Depression negative neurological ROS     GI/Hepatic Neg liver ROS, GERD  ,  Endo/Other  negative endocrine ROS  Renal/GU negative Renal ROS  negative genitourinary   Musculoskeletal   Abdominal   Peds  Hematology negative hematology ROS (+)   Anesthesia Other Findings Past Medical History: No date: Allergy No date: Arthritis No date: Depression No date: GERD (gastroesophageal reflux disease) No date: Hemorrhoid   Reproductive/Obstetrics negative OB ROS                             Anesthesia Physical Anesthesia Plan  ASA: 2  Anesthesia Plan: General   Post-op Pain Management:    Induction: Intravenous  PONV Risk Score and Plan: Propofol infusion and TIVA  Airway Management Planned: Natural Airway and Nasal Cannula  Additional Equipment:   Intra-op Plan:   Post-operative Plan:   Informed Consent: I have reviewed the patients History and Physical, chart, labs and discussed the procedure including the risks, benefits and alternatives for the proposed anesthesia with the patient or authorized representative who has indicated his/her understanding and  acceptance.     Dental Advisory Given  Plan Discussed with: Anesthesiologist, CRNA and Surgeon  Anesthesia Plan Comments:         Anesthesia Quick Evaluation

## 2022-02-12 NOTE — Transfer of Care (Signed)
Immediate Anesthesia Transfer of Care Note  Patient: Debbie Bell  Procedure(s) Performed: COLONOSCOPY WITH PROPOFOL (Rectum)  Patient Location: PACU  Anesthesia Type: General  Level of Consciousness: awake, alert  and patient cooperative  Airway and Oxygen Therapy: Patient Spontanous Breathing and Patient connected to supplemental oxygen  Post-op Assessment: Post-op Vital signs reviewed, Patient's Cardiovascular Status Stable, Respiratory Function Stable, Patent Airway and No signs of Nausea or vomiting  Post-op Vital Signs: Reviewed and stable  Complications: There were no known notable events for this encounter.

## 2022-02-14 ENCOUNTER — Other Ambulatory Visit: Payer: Self-pay | Admitting: Family Medicine

## 2022-02-14 DIAGNOSIS — J301 Allergic rhinitis due to pollen: Secondary | ICD-10-CM

## 2022-02-15 ENCOUNTER — Encounter: Payer: Self-pay | Admitting: Gastroenterology

## 2022-02-18 ENCOUNTER — Other Ambulatory Visit: Payer: Self-pay | Admitting: Family Medicine

## 2022-02-18 DIAGNOSIS — F324 Major depressive disorder, single episode, in partial remission: Secondary | ICD-10-CM

## 2022-03-02 ENCOUNTER — Other Ambulatory Visit: Payer: Self-pay

## 2022-03-02 DIAGNOSIS — M1811 Unilateral primary osteoarthritis of first carpometacarpal joint, right hand: Secondary | ICD-10-CM

## 2022-03-02 MED ORDER — MELOXICAM 15 MG PO TABS: 15.0000 mg | ORAL_TABLET | Freq: Every day | ORAL | 0 refills | Status: DC | PRN

## 2022-05-13 ENCOUNTER — Other Ambulatory Visit: Payer: Self-pay

## 2022-05-13 DIAGNOSIS — J301 Allergic rhinitis due to pollen: Secondary | ICD-10-CM

## 2022-05-13 MED ORDER — MONTELUKAST SODIUM 10 MG PO TABS: 10.0000 mg | ORAL_TABLET | Freq: Every evening | ORAL | 0 refills | Status: DC

## 2022-07-07 ENCOUNTER — Encounter: Payer: Self-pay | Admitting: Family Medicine

## 2022-07-07 ENCOUNTER — Ambulatory Visit: Payer: BC Managed Care – PPO | Admitting: Family Medicine

## 2022-07-07 ENCOUNTER — Ambulatory Visit (INDEPENDENT_AMBULATORY_CARE_PROVIDER_SITE_OTHER): Payer: BC Managed Care – PPO | Admitting: Family Medicine

## 2022-07-07 ENCOUNTER — Telehealth: Payer: Self-pay

## 2022-07-07 VITALS — BP 120/78 | HR 94 | Ht 65.0 in | Wt 152.0 lb

## 2022-07-07 DIAGNOSIS — Z23 Encounter for immunization: Secondary | ICD-10-CM | POA: Diagnosis not present

## 2022-07-07 DIAGNOSIS — F324 Major depressive disorder, single episode, in partial remission: Secondary | ICD-10-CM

## 2022-07-07 DIAGNOSIS — K219 Gastro-esophageal reflux disease without esophagitis: Secondary | ICD-10-CM

## 2022-07-07 DIAGNOSIS — J301 Allergic rhinitis due to pollen: Secondary | ICD-10-CM | POA: Diagnosis not present

## 2022-07-07 DIAGNOSIS — Z1231 Encounter for screening mammogram for malignant neoplasm of breast: Secondary | ICD-10-CM

## 2022-07-07 DIAGNOSIS — E785 Hyperlipidemia, unspecified: Secondary | ICD-10-CM

## 2022-07-07 DIAGNOSIS — R079 Chest pain, unspecified: Secondary | ICD-10-CM | POA: Diagnosis not present

## 2022-07-07 MED ORDER — MONTELUKAST SODIUM 10 MG PO TABS: 10.0000 mg | ORAL_TABLET | Freq: Every evening | ORAL | 1 refills | Status: DC

## 2022-07-07 MED ORDER — PANTOPRAZOLE SODIUM 40 MG PO TBEC: DELAYED_RELEASE_TABLET | ORAL | 1 refills | Status: DC

## 2022-07-07 MED ORDER — CITALOPRAM HYDROBROMIDE 40 MG PO TABS: 40.0000 mg | ORAL_TABLET | Freq: Every day | ORAL | 1 refills | Status: DC

## 2022-07-07 NOTE — Patient Instructions (Signed)
Famotidine Tablets What is this medication? FAMOTIDINE (fa MOE ti deen) treats heartburn, stomach ulcers, reflux disease, or other conditions that cause too much stomach acid. It works by reducing the amount of acid in the stomach. This medicine may be used for other purposes; ask your health care provider or pharmacist if you have questions. COMMON BRAND NAME(S): Heartburn Relief, Pepcid, Pepcid AC, Pepcid AC Maximum Strength, Zantac, Zantac 360 What should I tell my care team before I take this medication? They need to know if you have any of these conditions: Kidney disease Liver disease Trouble swallowing An unusual or allergic reaction to famotidine, other medications, foods, dyes, or preservatives Pregnant or trying to get pregnant Breast-feeding How should I use this medication? Take this medication by mouth with a glass of water. Follow the directions on the prescription label. If you only take this medication once a day, take it at bedtime. Take your doses at regular intervals. Do not take your medication more often than directed. Talk to your care team about the use of this medication in children. Special care may be needed. Overdosage: If you think you have taken too much of this medicine contact a poison control center or emergency room at once. NOTE: This medicine is only for you. Do not share this medicine with others. What if I miss a dose? If you miss a dose, take it as soon as you can. If it is almost time for your next dose, take only that dose. Do not take double or extra doses. What may interact with this medication? Delavirdine Itraconazole Ketoconazole This list may not describe all possible interactions. Give your health care provider a list of all the medicines, herbs, non-prescription drugs, or dietary supplements you use. Also tell them if you smoke, drink alcohol, or use illegal drugs. Some items may interact with your medicine. What should I watch for while using  this medication? Tell your care team if your condition does not start to get better or if it gets worse. Do not take with aspirin, ibuprofen or other antiinflammatory medications. These can make your condition worse. Do not smoke cigarettes or drink alcohol. These cause irritation in your stomach and can increase the time it will take for ulcers to heal. If you get black, tarry stools or vomit up what looks like coffee grounds, call your care team at once. You may have a bleeding ulcer. This medication may cause a decrease in vitamin B12. You should make sure that you get enough vitamin B12 while you are taking this medication. Discuss the foods you eat and the vitamins you take with your care team. What side effects may I notice from receiving this medication? Side effects that you should report to your care team as soon as possible: Allergic reactions--skin rash, itching, hives, swelling of the face, lips, tongue, or throat Confusion Hallucinations Side effects that usually do not require medical attention (report to your care team if they continue or are bothersome): Constipation Diarrhea Dizziness Headache This list may not describe all possible side effects. Call your doctor for medical advice about side effects. You may report side effects to FDA at 1-800-FDA-1088. Where should I keep my medication? Keep out of the reach of children and pets. Store at room temperature between 15 and 30 degrees C (59 and 86 degrees F). Do not freeze. Throw away any unused medication after the expiration date. NOTE: This sheet is a summary. It may not cover all possible information. If you have   questions about this medicine, talk to your doctor, pharmacist, or health care provider.  2023 Elsevier/Gold Standard (2020-05-27 00:00:00)  

## 2022-07-07 NOTE — Progress Notes (Addendum)
Date:  07/07/2022   Name:  Kacey Dysert   DOB:  06-22-1960   MRN:  [id number]   Chief Complaint: Flu Vaccine, Depression, Allergic Rhinitis , and Gastroesophageal Reflux  Depression        This is a chronic problem.  The current episode started more than 1 year ago.   The onset quality is gradual.   The problem occurs intermittently.  The problem has been gradually improving since onset.  Associated symptoms include no decreased concentration, no fatigue, no helplessness, no hopelessness, does not have insomnia, not irritable, no restlessness, no decreased interest, no appetite change, no body aches, no myalgias, no headaches, no indigestion, not sad and no suicidal ideas.     The symptoms are aggravated by nothing.  Past treatments include SSRIs - Selective serotonin reuptake inhibitors.  Compliance with treatment is good.  Previous treatment provided moderate relief. Gastroesophageal Reflux She complains of chest pain. She reports no abdominal pain, no belching, no choking, no coughing, no dysphagia, no heartburn, no hoarse voice, no nausea, no sore throat, no stridor or no wheezing. Pertinent negatives include no fatigue.  Chest Pain  This is a new problem. The current episode started more than 1 month ago. The onset quality is gradual. The pain is present in the substernal region. The quality of the pain is described as tightness and pressure. The pain does not radiate. Pertinent negatives include no abdominal pain, back pain, cough, dizziness, exertional chest pressure, fever, headaches, irregular heartbeat, nausea, palpitations or shortness of breath.    Lab Results  Component Value Date   NA 143 07/07/2021   K 4.7 07/07/2021   CO2 23 07/07/2021   GLUCOSE 84 07/07/2021   BUN 9 07/07/2021   CREATININE 0.82 07/07/2021   CALCIUM 10.2 07/07/2021   EGFR 83 07/07/2021   GFRNONAA 84 04/10/2019   Lab Results  Component Value Date   CHOL 192 07/07/2021   HDL 52 07/07/2021   LDLCALC  123 (H) 07/07/2021   TRIG 93 07/07/2021   CHOLHDL 3.0 10/23/2015   Lab Results  Component Value Date   TSH 0.641 10/19/2018   No results found for: "HGBA1C" Lab Results  Component Value Date   WBC 6.4 10/19/2018   HGB 14.4 10/19/2018   HCT 41.2 10/19/2018   MCV 90 10/19/2018   PLT 383 10/19/2018   Lab Results  Component Value Date   ALT 15 07/07/2021   AST 18 07/07/2021   ALKPHOS 95 07/07/2021   BILITOT 0.4 07/07/2021   No results found for: "25OHVITD2", "25OHVITD3", "VD25OH"   Review of Systems  Constitutional:  Negative for appetite change, chills, fatigue and fever.  HENT:  Negative for drooling, ear discharge, ear pain, hoarse voice and sore throat.   Respiratory:  Negative for cough, choking, shortness of breath and wheezing.   Cardiovascular:  Positive for chest pain. Negative for palpitations and leg swelling.  Gastrointestinal:  Negative for abdominal pain, blood in stool, constipation, diarrhea, dysphagia, heartburn and nausea.  Endocrine: Negative for polydipsia.  Genitourinary:  Negative for dysuria, frequency, hematuria and urgency.  Musculoskeletal:  Negative for back pain, myalgias and neck pain.  Skin:  Negative for rash.  Allergic/Immunologic: Negative for environmental allergies.  Neurological:  Negative for dizziness and headaches.  Hematological:  Does not bruise/bleed easily.  Psychiatric/Behavioral:  Positive for depression. Negative for decreased concentration and suicidal ideas. The patient is not nervous/anxious and does not have insomnia.     Patient Active Problem List  Diagnosis Date Noted   Encounter for screening colonoscopy    Primary osteoarthritis of first carpometacarpal joint of right hand 01/05/2022   Persistent cough 04/10/2019    Allergies  Allergen Reactions   Penicillins     Past Surgical History:  Procedure Laterality Date   COLONOSCOPY  2015   polyps/ repeat in 2 yrs- Dr Allen Norris   COLONOSCOPY WITH PROPOFOL N/A 02/12/2022    Procedure: COLONOSCOPY WITH PROPOFOL;  Surgeon: Lucilla Lame, MD;  Location: [place];  Service: Endoscopy;  Laterality: N/A;   TONSILLECTOMY      Social History   Tobacco Use   Smoking status: Never   Smokeless tobacco: Never  Vaping Use   Vaping Use: Never used  Substance Use Topics   Alcohol use: Yes    Comment: occasionally   Drug use: No     Medication list has been reviewed and updated.  Current Meds  Medication Sig   cetirizine-pseudoephedrine (ZYRTEC-D) 5-120 MG tablet Take 1 tablet by mouth 2 (two) times daily.   citalopram (CELEXA) 40 MG tablet TAKE 1 TABLET BY MOUTH DAILY   desoximetasone (TOPICORT) 0.25 % cream Apply bid prn   Flaxseed, Linseed, (FLAX SEED OIL PO) Take by mouth daily.   hydrocortisone (ANUSOL-HC) 2.5 % rectal cream Place 1 application rectally 2 (two) times daily.   meclizine (ANTIVERT) 25 MG tablet Take 1 tablet (25 mg total) by mouth 3 (three) times daily as needed for dizziness.   meloxicam (MOBIC) 15 MG tablet Take 1 tablet (15 mg total) by mouth daily as needed for pain.   montelukast (SINGULAIR) 10 MG tablet Take 1 tablet (10 mg total) by mouth every evening.   nystatin-triamcinolone (MYCOLOG II) cream APPLY TOPICALLY TWICE DAILY   pantoprazole (PROTONIX) 40 MG tablet TAKE 1 TABLET BY MOUTH EVERY DAY       07/07/2022    8:37 AM 02/02/2022    8:36 AM 01/05/2022    8:49 AM 01/04/2022    9:42 AM  GAD 7 : Generalized Anxiety Score  Nervous, Anxious, on Edge 0 0 0 0  Control/stop worrying 0 0 0 0  Worry too much - different things 0 0 0 0  Trouble relaxing 0 0 0 0  Restless 0 0 0 0  Easily annoyed or irritable 0 0 0 0  Afraid - awful might happen 0 0 0 0  Total GAD 7 Score 0 0 0 0  Anxiety Difficulty Not difficult at all Not difficult at all Not difficult at all Not difficult at all       07/07/2022    8:36 AM 02/02/2022    8:36 AM 01/05/2022    8:48 AM  Depression screen PHQ 2/9  Decreased Interest 0 1 1  Down, Depressed,  Hopeless 0 0 0  PHQ - 2 Score 0 1 1  Altered sleeping 0 2 2  Tired, decreased energy 0 1 1  Change in appetite 0 0 0  Feeling bad or failure about yourself  0 0 0  Trouble concentrating 0 1 1  Moving slowly or fidgety/restless 0 0 0  Suicidal thoughts 0 0 0  PHQ-9 Score 0 5 5  Difficult doing work/chores Not difficult at all Somewhat difficult Somewhat difficult    BP Readings from Last 3 Encounters:  07/07/22 120/78  02/12/22 106/64  02/02/22 126/86    Physical Exam Vitals and nursing note reviewed. Exam conducted with a chaperone present.  Constitutional:      General: She is  not irritable.She is not in acute distress.    Appearance: She is not diaphoretic.  HENT:     Head: Normocephalic and atraumatic.     Right Ear: Tympanic membrane and external ear normal.     Left Ear: Tympanic membrane and external ear normal.     Nose: Nose normal.     Mouth/Throat:     Mouth: Mucous membranes are moist.  Eyes:     General:        Right eye: No discharge.        Left eye: No discharge.     Conjunctiva/sclera: Conjunctivae normal.     Pupils: Pupils are equal, round, and reactive to light.  Neck:     Thyroid: No thyromegaly.     Vascular: No JVD.  Cardiovascular:     Rate and Rhythm: Normal rate and regular rhythm.     Heart sounds: Normal heart sounds. No murmur heard.    No friction rub. No gallop.  Pulmonary:     Effort: Pulmonary effort is normal.     Breath sounds: Normal breath sounds.  Chest:  Breasts:    Right: Normal. No swelling, bleeding, inverted nipple, mass, nipple discharge, skin change or tenderness.     Left: Normal. No swelling, inverted nipple, mass, nipple discharge, skin change or tenderness.  Abdominal:     General: Bowel sounds are normal.     Palpations: Abdomen is soft. There is no mass.     Tenderness: There is no abdominal tenderness. There is no guarding or rebound.  Musculoskeletal:        General: Normal range of motion.     Cervical back:  Normal range of motion and neck supple.  Lymphadenopathy:     Cervical: No cervical adenopathy.     Upper Body:     Right upper body: No supraclavicular or axillary adenopathy.     Left upper body: No supraclavicular or axillary adenopathy.  Skin:    General: Skin is warm and dry.     Findings: Lesion present.     Comments: Plantar wart left 3rd toe  Neurological:     Mental Status: She is alert.     Deep Tendon Reflexes: Reflexes are normal and symmetric.     Wt Readings from Last 3 Encounters:  07/07/22 152 lb (68.9 kg)  02/12/22 151 lb 9.6 oz (68.8 kg)  02/02/22 156 lb 6.4 oz (70.9 kg)    BP 120/78   Pulse 94   Ht 5\' 5"  (1.651 m)   Wt 152 lb (68.9 kg)   SpO2 97%   BMI 25.29 kg/m   Assessment and Plan:  1. Major depressive disorder in partial remission, unspecified whether recurrent (HCC) Chronic.  Controlled.  Stable.  PHQ is 0.  Continue citalopram 40 mg once a day. - citalopram (CELEXA) 40 MG tablet; Take 1 tablet (40 mg total) by mouth daily.  Dispense: 90 tablet; Refill: 1  2. Seasonal allergic rhinitis due to pollen Chronic.  Controlled.  Stable.  Continue Singulair 10 mg once a day. - montelukast (SINGULAIR) 10 MG tablet; Take 1 tablet (10 mg total) by mouth every evening.  Dispense: 90 tablet; Refill: 1  3. Hyperlipidemia, unspecified hyperlipidemia type Chronic.  Diet controlled.  Stable.  Will check lipid panel for current level of LDL.  And adjust accordingly. - Comprehensive Metabolic Panel (CMET) - Lipid Panel With LDL/HDL Ratio  4. Gastroesophageal reflux disease, unspecified whether esophagitis present Chronic.  Breakthrough at night with abnormal  discomfort of chest tightness but also discomfort in the left upper quadrant.  There is no palpable spleen tip and there is no palpable tenderness in the area I have suggested that in addition to continuance of the pantoprazole 40 mg that we will add famotidine 20 mg nightly over-the-counter as well as taken in  an acid as well. - pantoprazole (PROTONIX) 40 MG tablet; TAKE 1 TABLET BY MOUTH EVERY DAY  Dispense: 90 tablet; Refill: 1  5. Chest pain, unspecified type New onset described as tightness pressure someone sitting on the chest nonradiating substernal pain occurring only at night but not with activity this is atypical in nature but we will obtain an EKG with the following results.  Patient has sinus rhythm with a rate of 84 intervals are normal.  No voltage criteria that meets LVH.  There is no indication of ischemic changes by EKG measurements such as Q waves, ST-T wave changes nor delay in R wave progression.  This is most likely due to breakthrough reflux and we have suggested using as a forementioned famotidine 20 mg at night. - EKG 12-Lead  6. Breast cancer screening by mammogram Breast exam was normal and we will refer for screening mammogram. - MM 3D SCREEN BREAST BILATERAL     Elizabeth Sauer, MD

## 2022-07-07 NOTE — Addendum Note (Signed)
Addended by: Fredderick Severance on: 07/07/2022 09:12 AM   Modules accepted: Level of Service

## 2022-07-07 NOTE — Telephone Encounter (Signed)
Called pt with mammo in [place] on 07/14/22 @ 820

## 2022-07-08 ENCOUNTER — Other Ambulatory Visit: Payer: Self-pay

## 2022-07-08 DIAGNOSIS — E782 Mixed hyperlipidemia: Secondary | ICD-10-CM

## 2022-07-08 LAB — LIPID PANEL WITH LDL/HDL RATIO
Cholesterol, Total: 210 mg/dL — ABNORMAL HIGH (ref 100–199)
HDL: 48 mg/dL (ref >39)
LDL Chol Calc (NIH): 137 mg/dL — ABNORMAL HIGH (ref 0–99)
LDL/HDL Ratio: 2.9 ratio (ref 0.0–3.2)
Triglycerides: 140 mg/dL (ref 0–149)
VLDL Cholesterol Cal: 25 mg/dL (ref 5–40)

## 2022-07-08 LAB — COMPREHENSIVE METABOLIC PANEL
ALT: 13 IU/L (ref 0–32)
AST: 20 IU/L (ref 0–40)
Albumin/Globulin Ratio: 1.6 (ref 1.2–2.2)
Albumin: 4.5 g/dL (ref 3.8–4.9)
Alkaline Phosphatase: 87 IU/L (ref 44–121)
BUN/Creatinine Ratio: 13 (ref 9–23)
BUN: 10 mg/dL (ref 6–24)
Bilirubin Total: 0.4 mg/dL (ref 0.0–1.2)
CO2: 24 mmol/L (ref 20–29)
Calcium: 9.6 mg/dL (ref 8.7–10.2)
Chloride: 101 mmol/L (ref 96–106)
Creatinine, Ser: 0.8 mg/dL (ref 0.57–1.00)
Globulin, Total: 2.8 g/dL (ref 1.5–4.5)
Glucose: 78 mg/dL (ref 70–99)
Potassium: 4.4 mmol/L (ref 3.5–5.2)
Sodium: 142 mmol/L (ref 134–144)
Total Protein: 7.3 g/dL (ref 6.0–8.5)
eGFR: 85 mL/min/{1.73_m2} (ref >59)

## 2022-07-08 MED ORDER — ROSUVASTATIN CALCIUM 5 MG PO TABS: 5.0000 mg | ORAL_TABLET | Freq: Every day | ORAL | 0 refills | Status: DC

## 2022-07-14 ENCOUNTER — Ambulatory Visit
Admission: RE | Admit: 2022-07-14 | Discharge: 2022-07-14 | Disposition: A | Discharge status: Home / Self Care | Payer: BC Managed Care – PPO | Source: Ambulatory Visit | Attending: Family Medicine | Admitting: Family Medicine

## 2022-07-14 DIAGNOSIS — Z1231 Encounter for screening mammogram for malignant neoplasm of breast: Secondary | ICD-10-CM | POA: Insufficient documentation

## 2022-08-07 DIAGNOSIS — K449 Diaphragmatic hernia without obstruction or gangrene: Secondary | ICD-10-CM | POA: Diagnosis not present

## 2022-08-07 DIAGNOSIS — Z880 Allergy status to penicillin: Secondary | ICD-10-CM | POA: Diagnosis not present

## 2022-08-07 DIAGNOSIS — K219 Gastro-esophageal reflux disease without esophagitis: Secondary | ICD-10-CM | POA: Diagnosis not present

## 2022-08-07 DIAGNOSIS — E785 Hyperlipidemia, unspecified: Secondary | ICD-10-CM | POA: Diagnosis not present

## 2022-08-07 DIAGNOSIS — R0789 Other chest pain: Secondary | ICD-10-CM | POA: Diagnosis not present

## 2022-08-07 DIAGNOSIS — E041 Nontoxic single thyroid nodule: Secondary | ICD-10-CM | POA: Diagnosis not present

## 2022-08-07 DIAGNOSIS — F32A Depression, unspecified: Secondary | ICD-10-CM | POA: Diagnosis not present

## 2022-08-07 DIAGNOSIS — R0781 Pleurodynia: Secondary | ICD-10-CM | POA: Diagnosis not present

## 2022-08-07 DIAGNOSIS — R0600 Dyspnea, unspecified: Secondary | ICD-10-CM | POA: Diagnosis not present

## 2022-08-07 DIAGNOSIS — R000 Tachycardia, unspecified: Secondary | ICD-10-CM | POA: Diagnosis not present

## 2022-08-07 DIAGNOSIS — R079 Chest pain, unspecified: Secondary | ICD-10-CM | POA: Diagnosis not present

## 2022-08-09 ENCOUNTER — Telehealth: Payer: Self-pay

## 2022-08-09 NOTE — Transitions of Care (Post Inpatient/ED Visit) (Signed)
   08/09/2022  Name: Debbie Bell MRN: [id number] DOB: September 18, 1960  Today's TOC FU Call Status: Today's TOC FU Call Status:: Successful TOC FU Call Competed TOC FU Call Complete Date: 08/09/22  Transition Care Management Follow-up Telephone Call Date of Discharge: 08/07/22 Discharge Facility: Other ([facility]) Type of Discharge: Emergency Department Reason for ED Visit: Cardiac Conditions Cardiac Conditions Diagnosis: Chest Pain Persisting How have you been since you were released from the hospital?: Better Any questions or concerns?: No  Items Reviewed: Did you receive and understand the discharge instructions provided?: Yes Medications obtained and verified?: Yes (Medications Reviewed) Any new allergies since your discharge?: No Dietary orders reviewed?: NA Do you have support at home?: Yes People in Home: significant other Name of Support/Comfort Primary Source: [facility] and Equipment/Supplies: [place] Ordered?: NA Any new equipment or medical supplies ordered?: NA  Functional Questionnaire: Do you need assistance with bathing/showering or dressing?: No Do you need assistance with meal preparation?: No Do you need assistance with eating?: No Do you have difficulty maintaining continence: No Do you need assistance with getting out of bed/getting out of a chair/moving?: No Do you have difficulty managing or taking your medications?: No  Folllow up appointments reviewed: PCP Follow-up appointment confirmed?: Yes Date of PCP follow-up appointment?: 08/09/22 Follow-up Provider: [facility] Follow-up appointment confirmed?: NA Do you need transportation to your follow-up appointment?: No Do you understand care options if your condition(s) worsen?: Yes-patient verbalized understanding    SIGNATURE Berton Mount

## 2022-08-10 ENCOUNTER — Ambulatory Visit (INDEPENDENT_AMBULATORY_CARE_PROVIDER_SITE_OTHER): Payer: BC Managed Care – PPO | Admitting: Family Medicine

## 2022-08-10 ENCOUNTER — Encounter: Payer: Self-pay | Admitting: Family Medicine

## 2022-08-10 VITALS — BP 100/70 | HR 78 | Ht 65.0 in | Wt 154.0 lb

## 2022-08-10 DIAGNOSIS — E041 Nontoxic single thyroid nodule: Secondary | ICD-10-CM | POA: Diagnosis not present

## 2022-08-10 DIAGNOSIS — M940 Chondrocostal junction syndrome [Tietze]: Secondary | ICD-10-CM | POA: Diagnosis not present

## 2022-08-10 MED ORDER — MELOXICAM 15 MG PO TABS: 15.0000 mg | ORAL_TABLET | Freq: Every day | ORAL | 0 refills | Status: DC

## 2022-08-10 NOTE — Progress Notes (Signed)
Date:  08/10/2022   Name:  Debbie Bell   DOB:  March 29, 1961   MRN:  [id number]   Chief Complaint: Follow-up (ER visit from 08/07/22. TOC call placed on 08/09/22. Prescribed pantoprazole- no more chest pains. On CT- small hiatal hernia and Thyroid nodule seen)  Chest Pain  This is a new problem. The current episode started in the past 7 days. The problem has been waxing and waning. The pain is present in the substernal region (upper). The pain is at a severity of 5/10. The pain is moderate. The quality of the pain is described as sharp ("strong ache"). Pertinent negatives include no cough, exertional chest pressure, palpitations or shortness of breath. The pain is aggravated by nothing.  Her past medical history is significant for thyroid problem.  Thyroid Problem Presents for initial (Right thyroid nodule/CT scan) visit. Patient reports no cold intolerance, dry skin, hair loss, heat intolerance, nail problem, palpitations, weight gain or weight loss. The treatment provided mild relief.    Lab Results  Component Value Date   NA 142 07/07/2022   K 4.4 07/07/2022   CO2 24 07/07/2022   GLUCOSE 78 07/07/2022   BUN 10 07/07/2022   CREATININE 0.80 07/07/2022   CALCIUM 9.6 07/07/2022   EGFR 85 07/07/2022   GFRNONAA 84 04/10/2019   Lab Results  Component Value Date   CHOL 210 (H) 07/07/2022   HDL 48 07/07/2022   LDLCALC 137 (H) 07/07/2022   TRIG 140 07/07/2022   CHOLHDL 3.0 10/23/2015   Lab Results  Component Value Date   TSH 0.641 10/19/2018   No results found for: "HGBA1C" Lab Results  Component Value Date   WBC 6.4 10/19/2018   HGB 14.4 10/19/2018   HCT 41.2 10/19/2018   MCV 90 10/19/2018   PLT 383 10/19/2018   Lab Results  Component Value Date   ALT 13 07/07/2022   AST 20 07/07/2022   ALKPHOS 87 07/07/2022   BILITOT 0.4 07/07/2022   No results found for: "25OHVITD2", "25OHVITD3", "VD25OH"   Review of Systems  Constitutional:  Negative for weight gain and weight  loss.  Respiratory:  Negative for cough, chest tightness, shortness of breath and wheezing.   Cardiovascular:  Positive for chest pain. Negative for palpitations and leg swelling.  Endocrine: Negative for cold intolerance and heat intolerance.    Patient Active Problem List   Diagnosis Date Noted   Encounter for screening colonoscopy    Primary osteoarthritis of first carpometacarpal joint of right hand 01/05/2022   Persistent cough 04/10/2019    Allergies  Allergen Reactions   Penicillins     Past Surgical History:  Procedure Laterality Date   COLONOSCOPY  2015   polyps/ repeat in 2 yrs- Dr Allen Norris   COLONOSCOPY WITH PROPOFOL N/A 02/12/2022   Procedure: COLONOSCOPY WITH PROPOFOL;  Surgeon: Lucilla Lame, MD;  Location: [place];  Service: Endoscopy;  Laterality: N/A;   TONSILLECTOMY      Social History   Tobacco Use   Smoking status: Never   Smokeless tobacco: Never  Vaping Use   Vaping Use: Never used  Substance Use Topics   Alcohol use: Yes    Comment: occasionally   Drug use: No     Medication list has been reviewed and updated.  Current Meds  Medication Sig   cetirizine-pseudoephedrine (ZYRTEC-D) 5-120 MG tablet Take 1 tablet by mouth 2 (two) times daily.   citalopram (CELEXA) 40 MG tablet Take 1 tablet (40 mg total) by  mouth daily.   desoximetasone (TOPICORT) 0.25 % cream Apply bid prn   Flaxseed, Linseed, (FLAX SEED OIL PO) Take by mouth daily.   hydrocortisone (ANUSOL-HC) 2.5 % rectal cream Place 1 application rectally 2 (two) times daily.   meclizine (ANTIVERT) 25 MG tablet Take 1 tablet (25 mg total) by mouth 3 (three) times daily as needed for dizziness.   montelukast (SINGULAIR) 10 MG tablet Take 1 tablet (10 mg total) by mouth every evening.   nystatin-triamcinolone (MYCOLOG II) cream APPLY TOPICALLY TWICE DAILY   pantoprazole (PROTONIX) 40 MG tablet TAKE 1 TABLET BY MOUTH EVERY DAY   rosuvastatin (CRESTOR) 5 MG tablet Take 1 tablet (5 mg total)  by mouth daily.       08/10/2022    3:10 PM 07/07/2022    8:37 AM 02/02/2022    8:36 AM 01/05/2022    8:49 AM  GAD 7 : Generalized Anxiety Score  Nervous, Anxious, on Edge 0 0 0 0  Control/stop worrying 0 0 0 0  Worry too much - different things 0 0 0 0  Trouble relaxing 0 0 0 0  Restless 0 0 0 0  Easily annoyed or irritable 0 0 0 0  Afraid - awful might happen 0 0 0 0  Total GAD 7 Score 0 0 0 0  Anxiety Difficulty Not difficult at all Not difficult at all Not difficult at all Not difficult at all       08/10/2022    3:10 PM 07/07/2022    8:36 AM 02/02/2022    8:36 AM  Depression screen PHQ 2/9  Decreased Interest 0 0 1  Down, Depressed, Hopeless 0 0 0  PHQ - 2 Score 0 0 1  Altered sleeping 0 0 2  Tired, decreased energy 0 0 1  Change in appetite 0 0 0  Feeling bad or failure about yourself  0 0 0  Trouble concentrating 0 0 1  Moving slowly or fidgety/restless 0 0 0  Suicidal thoughts 0 0 0  PHQ-9 Score 0 0 5  Difficult doing work/chores Not difficult at all Not difficult at all Somewhat difficult    BP Readings from Last 3 Encounters:  07/07/22 120/78  02/12/22 106/64  02/02/22 126/86    Physical Exam Vitals and nursing note reviewed.  HENT:     Head: Normocephalic.     Right Ear: Tympanic membrane normal.     Left Ear: Tympanic membrane normal.     Nose: Nose normal.     Mouth/Throat:     Mouth: Mucous membranes are moist.  Eyes:     Pupils: Pupils are equal, round, and reactive to light.  Neck:     Vascular: No carotid bruit.  Cardiovascular:     Heart sounds: No murmur heard.    No friction rub. No gallop.  Pulmonary:     Breath sounds: No wheezing, rhonchi or rales.  Chest:     Chest wall: Tenderness present.    Abdominal:     Palpations: Abdomen is soft.  Musculoskeletal:     Cervical back: Normal range of motion and neck supple. No tenderness.  Neurological:     Mental Status: She is alert.     Wt Readings from Last 3 Encounters:  08/10/22  154 lb (69.9 kg)  07/07/22 152 lb (68.9 kg)  02/12/22 151 lb 9.6 oz (68.8 kg)    Ht '5\' 5"'$  (1.651 m)   Wt 154 lb (69.9 kg)   BMI 25.63 kg/m  Assessment and Plan:  1. Costochondritis New onset.  Persistent.  Intermittent.  Patient has been lifting heavy dog sitter almost "dead weight".  There is tenderness over the costochondral margins superiorly suggesting costochondritis.  We will treat with meloxicam 15 mg once a day and encouraged to take Tylenol on an as-needed basis. - meloxicam (MOBIC) 15 MG tablet; Take 1 tablet (15 mg total) by mouth daily.  Dispense: 30 tablet; Refill: 0  2. Thyroid nodule New onset patient was noted on CT scan to have a nodule of the right thyroid.  We will proceed with an ultrasound of the nodule and we will check a thyroid panel to determine if it is hypohyper- or normal thyroid etiology. - US THYROID - Thyroid Panel With TSH    Otilio Miu, MD

## 2022-08-11 LAB — THYROID PANEL WITH TSH
Free Thyroxine Index: 2 (ref 1.2–4.9)
T3 Uptake Ratio: 25 % (ref 24–39)
T4, Total: 8.1 ug/dL (ref 4.5–12.0)
TSH: 0.803 u[IU]/mL (ref 0.450–4.500)

## 2022-08-13 ENCOUNTER — Ambulatory Visit
Admission: RE | Admit: 2022-08-13 | Discharge: 2022-08-13 | Disposition: A | Discharge status: Home / Self Care | Payer: BC Managed Care – PPO | Source: Ambulatory Visit | Attending: Family Medicine | Admitting: Family Medicine

## 2022-08-13 DIAGNOSIS — E041 Nontoxic single thyroid nodule: Secondary | ICD-10-CM | POA: Diagnosis not present

## 2022-08-17 ENCOUNTER — Telehealth: Payer: Self-pay

## 2022-08-17 NOTE — Telephone Encounter (Signed)
Called pt and scheduled appt with Dr. Kathyrn Sheriff in the morning 08/18/2022 @ 10:30. Pt aware and will be going

## 2022-08-18 DIAGNOSIS — E041 Nontoxic single thyroid nodule: Secondary | ICD-10-CM | POA: Diagnosis not present

## 2022-08-19 ENCOUNTER — Other Ambulatory Visit: Payer: Self-pay | Admitting: Otolaryngology

## 2022-08-19 DIAGNOSIS — E041 Nontoxic single thyroid nodule: Secondary | ICD-10-CM

## 2022-08-25 NOTE — Progress Notes (Signed)
Patient for US guided FNA Thyroid Bx on Thurs 08/26/2022, I called and LVM for the patient on the phone and gave pre-procedure instructions. VM told pt to be here at 12:30p and check in at the [facility] registration desk.  Called  08/25/2022

## 2022-08-26 ENCOUNTER — Ambulatory Visit
Admission: RE | Admit: 2022-08-26 | Discharge: 2022-08-26 | Disposition: A | Discharge status: Home / Self Care | Payer: BC Managed Care – PPO | Source: Ambulatory Visit | Attending: Otolaryngology | Admitting: Otolaryngology

## 2022-08-26 DIAGNOSIS — E041 Nontoxic single thyroid nodule: Secondary | ICD-10-CM | POA: Insufficient documentation

## 2022-08-26 DIAGNOSIS — D440 Neoplasm of uncertain behavior of thyroid gland: Secondary | ICD-10-CM | POA: Diagnosis not present

## 2022-08-26 MED ORDER — LIDOCAINE HCL (PF) 1 % IJ SOLN
10.0000 mL | Freq: Once | INTRAMUSCULAR | Status: AC
  Administered 2022-08-26: 10 mL via INTRADERMAL

## 2022-08-26 NOTE — Procedures (Signed)
Successful US guided FNA of right mid thyroid nodule No complications. See PACS for full report.  Hedy Jacob, PA-C 08/26/2022, 1:49 PM

## 2022-09-10 ENCOUNTER — Other Ambulatory Visit: Payer: Self-pay

## 2022-09-10 DIAGNOSIS — M940 Chondrocostal junction syndrome [Tietze]: Secondary | ICD-10-CM

## 2022-09-10 MED ORDER — MELOXICAM 15 MG PO TABS: 15.0000 mg | ORAL_TABLET | Freq: Every day | ORAL | 0 refills | Status: DC

## 2022-09-14 ENCOUNTER — Encounter: Payer: Self-pay | Admitting: Otolaryngology

## 2022-09-14 LAB — CYTOLOGY - NON PAP

## 2022-09-16 ENCOUNTER — Other Ambulatory Visit: Payer: Self-pay | Admitting: Otolaryngology

## 2022-09-16 DIAGNOSIS — E079 Disorder of thyroid, unspecified: Secondary | ICD-10-CM

## 2022-09-23 ENCOUNTER — Other Ambulatory Visit: Payer: Self-pay

## 2022-09-23 DIAGNOSIS — E782 Mixed hyperlipidemia: Secondary | ICD-10-CM

## 2022-09-23 MED ORDER — ROSUVASTATIN CALCIUM 5 MG PO TABS: 5.0000 mg | ORAL_TABLET | Freq: Every day | ORAL | 0 refills | Status: DC

## 2022-10-12 ENCOUNTER — Other Ambulatory Visit: Payer: Self-pay

## 2022-10-12 DIAGNOSIS — M940 Chondrocostal junction syndrome [Tietze]: Secondary | ICD-10-CM

## 2022-10-12 MED ORDER — MELOXICAM 15 MG PO TABS: 15.0000 mg | ORAL_TABLET | Freq: Every day | ORAL | 0 refills | Status: DC

## 2022-10-28 ENCOUNTER — Other Ambulatory Visit: Payer: Self-pay

## 2022-10-28 DIAGNOSIS — B354 Tinea corporis: Secondary | ICD-10-CM

## 2022-10-28 DIAGNOSIS — L259 Unspecified contact dermatitis, unspecified cause: Secondary | ICD-10-CM

## 2022-10-28 DIAGNOSIS — K649 Unspecified hemorrhoids: Secondary | ICD-10-CM

## 2022-10-28 MED ORDER — HYDROCORTISONE (PERIANAL) 2.5 % EX CREA: 1.0000 | TOPICAL_CREAM | Freq: Two times a day (BID) | CUTANEOUS | 0 refills | Status: DC

## 2022-10-28 MED ORDER — NYSTATIN-TRIAMCINOLONE 100000-0.1 UNIT/GM-% EX CREA: TOPICAL_CREAM | Freq: Two times a day (BID) | CUTANEOUS | 0 refills | Status: DC

## 2022-11-02 ENCOUNTER — Ambulatory Visit (INDEPENDENT_AMBULATORY_CARE_PROVIDER_SITE_OTHER): Payer: BC Managed Care – PPO | Admitting: Family Medicine

## 2022-11-02 VITALS — BP 124/76 | HR 107 | Temp 98.1°F | Ht 65.0 in | Wt 150.0 lb

## 2022-11-02 DIAGNOSIS — J0100 Acute maxillary sinusitis, unspecified: Secondary | ICD-10-CM | POA: Diagnosis not present

## 2022-11-02 DIAGNOSIS — K219 Gastro-esophageal reflux disease without esophagitis: Secondary | ICD-10-CM | POA: Diagnosis not present

## 2022-11-02 DIAGNOSIS — R053 Chronic cough: Secondary | ICD-10-CM | POA: Diagnosis not present

## 2022-11-02 MED ORDER — FAMOTIDINE 40 MG PO TABS: 40.0000 mg | ORAL_TABLET | Freq: Every day | ORAL | 1 refills | Status: DC

## 2022-11-02 MED ORDER — AZITHROMYCIN 250 MG PO TABS: ORAL_TABLET | ORAL | 0 refills | Status: AC

## 2022-11-02 MED ORDER — PROMETHAZINE-DM 6.25-15 MG/5ML PO SYRP: 5.0000 mL | ORAL_SOLUTION | Freq: Four times a day (QID) | ORAL | 0 refills | Status: DC | PRN

## 2022-11-02 NOTE — Progress Notes (Signed)
Date:  11/02/2022   Name:  Debbie Bell   DOB:  Sep 28, 1960   MRN:  [id number]   Chief Complaint: Cough (Started over weekend with dry cough and some fever- little production that is white)  Cough This is a new problem. The current episode started in the past 7 days. The problem has been waxing and waning. The problem occurs constantly. The cough is Non-productive. Associated symptoms include a fever, headaches, nasal congestion, postnasal drip, rhinorrhea, shortness of breath, sweats and wheezing. Pertinent negatives include no chest pain, chills, ear pain, heartburn, hemoptysis, myalgias or sore throat. She has tried OTC cough suppressant (mucinex dn) for the symptoms. The treatment provided no relief.  Sinus Problem This is a new problem. The current episode started in the past 7 days. The problem has been waxing and waning since onset. Associated symptoms include congestion, coughing, headaches, shortness of breath and sinus pressure. Pertinent negatives include no chills, ear pain or sore throat. Treatments tried: zyrtec.  Gastroesophageal Reflux She complains of coughing and wheezing. She reports no abdominal pain, no chest pain, no heartburn, no nausea or no sore throat. The symptoms are aggravated by certain foods. She has tried a PPI for the symptoms. The treatment provided moderate relief.    Lab Results  Component Value Date   NA 142 07/07/2022   K 4.4 07/07/2022   CO2 24 07/07/2022   GLUCOSE 78 07/07/2022   BUN 10 07/07/2022   CREATININE 0.80 07/07/2022   CALCIUM 9.6 07/07/2022   EGFR 85 07/07/2022   GFRNONAA 84 04/10/2019   Lab Results  Component Value Date   CHOL 210 (H) 07/07/2022   HDL 48 07/07/2022   LDLCALC 137 (H) 07/07/2022   TRIG 140 07/07/2022   CHOLHDL 3.0 10/23/2015   Lab Results  Component Value Date   TSH 0.803 08/10/2022   No results found for: "HGBA1C" Lab Results  Component Value Date   WBC 6.4 10/19/2018   HGB 14.4 10/19/2018   HCT 41.2  10/19/2018   MCV 90 10/19/2018   PLT 383 10/19/2018   Lab Results  Component Value Date   ALT 13 07/07/2022   AST 20 07/07/2022   ALKPHOS 87 07/07/2022   BILITOT 0.4 07/07/2022   No results found for: "25OHVITD2", "25OHVITD3", "VD25OH"   Review of Systems  Constitutional:  Positive for fever. Negative for chills.  HENT:  Positive for congestion, postnasal drip, rhinorrhea and sinus pressure. Negative for ear pain and sore throat.   Respiratory:  Positive for cough, shortness of breath and wheezing. Negative for hemoptysis and chest tightness.   Cardiovascular:  Negative for chest pain and palpitations.  Gastrointestinal:  Negative for abdominal pain, heartburn and nausea.  Musculoskeletal:  Negative for myalgias.  Neurological:  Positive for headaches.    Patient Active Problem List   Diagnosis Date Noted   Encounter for screening colonoscopy    Primary osteoarthritis of first carpometacarpal joint of right hand 01/05/2022   Persistent cough 04/10/2019    Allergies  Allergen Reactions   Penicillins     Past Surgical History:  Procedure Laterality Date   COLONOSCOPY  2015   polyps/ repeat in 2 yrs- Dr Servando Snare   COLONOSCOPY WITH PROPOFOL N/A 02/12/2022   Procedure: COLONOSCOPY WITH PROPOFOL;  Surgeon: Midge Minium, MD;  Location: [facility];  Service: Endoscopy;  Laterality: N/A;   TONSILLECTOMY      Social History   Tobacco Use   Smoking status: Never   Smokeless tobacco:  Never  Vaping Use   Vaping Use: Never used  Substance Use Topics   Alcohol use: Yes    Comment: occasionally   Drug use: No     Medication list has been reviewed and updated.  Current Meds  Medication Sig   cetirizine-pseudoephedrine (ZYRTEC-D) 5-120 MG tablet Take 1 tablet by mouth 2 (two) times daily.   citalopram (CELEXA) 40 MG tablet Take 1 tablet (40 mg total) by mouth daily.   desoximetasone (TOPICORT) 0.25 % cream Apply bid prn   Flaxseed, Linseed, (FLAX SEED OIL PO) Take by  mouth daily.   hydrocortisone (ANUSOL-HC) 2.5 % rectal cream Place 1 Application rectally 2 (two) times daily. Place 1 application rectally 2 (two) times daily.   meclizine (ANTIVERT) 25 MG tablet Take 1 tablet (25 mg total) by mouth 3 (three) times daily as needed for dizziness.   meloxicam (MOBIC) 15 MG tablet Take 1 tablet (15 mg total) by mouth daily.   montelukast (SINGULAIR) 10 MG tablet Take 1 tablet (10 mg total) by mouth every evening.   nystatin-triamcinolone (MYCOLOG II) cream Apply topically 2 (two) times daily.   pantoprazole (PROTONIX) 40 MG tablet TAKE 1 TABLET BY MOUTH EVERY DAY   rosuvastatin (CRESTOR) 5 MG tablet Take 1 tablet (5 mg total) by mouth daily.       11/02/2022    1:42 PM 08/10/2022    3:10 PM 07/07/2022    8:37 AM 02/02/2022    8:36 AM  GAD 7 : Generalized Anxiety Score  Nervous, Anxious, on Edge 0 0 0 0  Control/stop worrying 0 0 0 0  Worry too much - different things 0 0 0 0  Trouble relaxing 0 0 0 0  Restless 0 0 0 0  Easily annoyed or irritable 0 0 0 0  Afraid - awful might happen 0 0 0 0  Total GAD 7 Score 0 0 0 0  Anxiety Difficulty Not difficult at all Not difficult at all Not difficult at all Not difficult at all       11/02/2022    1:42 PM 08/10/2022    3:10 PM 07/07/2022    8:36 AM  Depression screen PHQ 2/9  Decreased Interest 0 0 0  Down, Depressed, Hopeless 0 0 0  PHQ - 2 Score 0 0 0  Altered sleeping 0 0 0  Tired, decreased energy 0 0 0  Change in appetite 0 0 0  Feeling bad or failure about yourself  0 0 0  Trouble concentrating 0 0 0  Moving slowly or fidgety/restless 0 0 0  Suicidal thoughts 0 0 0  PHQ-9 Score 0 0 0  Difficult doing work/chores Not difficult at all Not difficult at all Not difficult at all    BP Readings from Last 3 Encounters:  11/02/22 124/76  08/10/22 100/70  07/07/22 120/78    Physical Exam Vitals and nursing note reviewed. Exam conducted with a chaperone present.  Constitutional:      General: She is  not in acute distress.    Appearance: She is not diaphoretic.  HENT:     Head: Normocephalic and atraumatic.     Right Ear: Tympanic membrane and external ear normal.     Left Ear: External ear normal. A middle ear effusion is present.     Nose:     Right Turbinates: Enlarged.     Left Turbinates: Enlarged.     Right Sinus: Maxillary sinus tenderness present. No frontal sinus tenderness.  Left Sinus: Maxillary sinus tenderness present. No frontal sinus tenderness.     Mouth/Throat:     Lips: Pink.     Mouth: Mucous membranes are moist.     Pharynx: Oropharynx is clear.  Eyes:     General:        Right eye: No discharge.        Left eye: No discharge.     Conjunctiva/sclera: Conjunctivae normal.     Pupils: Pupils are equal, round, and reactive to light.  Neck:     Thyroid: No thyromegaly.     Vascular: No JVD.  Cardiovascular:     Rate and Rhythm: Normal rate and regular rhythm.     Heart sounds: Normal heart sounds, S1 normal and S2 normal. No murmur heard.    No systolic murmur is present.     No diastolic murmur is present.     No friction rub. No gallop. No S3 or S4 sounds.  Pulmonary:     Effort: Pulmonary effort is normal.     Breath sounds: Normal breath sounds. No decreased breath sounds, wheezing, rhonchi or rales.  Abdominal:     General: Bowel sounds are normal.     Palpations: Abdomen is soft. There is no mass.     Tenderness: There is no abdominal tenderness. There is no guarding.  Musculoskeletal:        General: Normal range of motion.     Cervical back: Normal range of motion and neck supple.  Lymphadenopathy:     Cervical: No cervical adenopathy.  Skin:    General: Skin is warm and dry.  Neurological:     Mental Status: She is alert.     Deep Tendon Reflexes: Reflexes are normal and symmetric.     Wt Readings from Last 3 Encounters:  11/02/22 150 lb (68 kg)  08/10/22 154 lb (69.9 kg)  07/07/22 152 lb (68.9 kg)    BP 124/76   Pulse (!) 107    Temp 98.1 F (36.7 C) (Oral)   Ht 5\' 5"  (1.651 m)   Wt 150 lb (68 kg)   SpO2 97%   BMI 24.96 kg/m   Assessment and Plan: 1. Acute maxillary sinusitis, recurrence not specified New onset.  Persistent.  Stable.  History and examination is consistent with a sinus infection with tenderness primarily over the maxillary sinuses.  We will initiate azithromycin to 50 mg 2 tablets today followed by 1 a day for 4 days.  Will recheck on as-needed basis. - azithromycin (ZITHROMAX) 250 MG tablet; Take 2 tablets on day 1, then 1 tablet daily on days 2 through 5  Dispense: 6 tablet; Refill: 0  2. Persistent cough Persistent cough and nonproductive examination the lungs are clear with no with rhonchi wheezes or rubs or rales.  Patient may take Mucinex DM during the day and we will prescribe promethazine with dextromethorphan at night. - promethazine-dextromethorphan (PROMETHAZINE-DM) 6.25-15 MG/5ML syrup; Take 5 mLs by mouth 4 (four) times daily as needed.  Dispense: 118 mL; Refill: 0  3. Gastroesophageal reflux disease, unspecified whether esophagitis present Patient was questioning whether there should be a continue with the Protonix.  Which is the only thing in the past that has controlled her reflux.  We were discussing whether or not she needs to see gastroenterology so our first step will be to try famotidine 40 mg initially to see if H2 blocker well controls symptomatology however if it is breakthrough and she has to return to [employer]  we will discuss referral to gastroenterology for evaluation. - famotidine (PEPCID) 40 MG tablet; Take 1 tablet (40 mg total) by mouth daily.  Dispense: 30 tablet; Refill: 1     Elizabeth Sauer, MD

## 2022-11-10 ENCOUNTER — Other Ambulatory Visit: Payer: Self-pay | Admitting: Family Medicine

## 2022-11-10 DIAGNOSIS — J0100 Acute maxillary sinusitis, unspecified: Secondary | ICD-10-CM

## 2022-11-10 DIAGNOSIS — R053 Chronic cough: Secondary | ICD-10-CM

## 2022-11-10 NOTE — Telephone Encounter (Signed)
Unable to refill per protocol, Rx request was lasted refilled 11/02/22 for short supply.  Requested Prescriptions  Pending Prescriptions Disp Refills   azithromycin (ZITHROMAX) 250 MG tablet [Pharmacy Med Name: AZITHROMYCIN 250MG  TABLETS 6-PAK] 6 tablet 0    Sig: TAKE 2 TABLETS BY MOUTH FOR 1 DAY THEN TAKE 1 TABLET BY MOUTH DAILY FOR 4 DAYS     Off-Protocol Failed - 11/10/2022  8:31 AM      Failed - Medication not assigned to a protocol, review manually.      Passed - Valid encounter within last 12 months    Recent Outpatient Visits           1 week ago Acute maxillary sinusitis, recurrence not specified   [place] [facility], [facility]   3 months ago Thyroid nodule   [place] [facility], MD   4 months ago Major depressive disorder in partial remission, unspecified whether recurrent [facility])   [place] [facility], MD   9 months ago Primary osteoarthritis of first carpometacarpal joint of right hand   [place] [facility], Ocie Bob, MD   10 months ago Primary osteoarthritis of first carpometacarpal joint of right hand   [place] [facility], Ocie Bob, MD       Future Appointments             In 1 month Duanne Limerick, MD [facility] at [facility]             promethazine-dextromethorphan (PROMETHAZINE-DM) 6.25-15 MG/5ML syrup [Pharmacy Med Name: PROMETHAZINE DM SYRUP] 118 mL 0    Sig: TAKE 5 ML BY MOUTH FOUR TIMES DAILY AS NEEDED     Ear, Nose, and Throat:  Antitussives/Expectorants Passed - 11/10/2022  8:31 AM      Passed - Valid encounter within last 12 months    Recent Outpatient Visits           1 week ago Acute maxillary sinusitis, recurrence not specified   [facility], MD   3 months ago Thyroid nodule   [place] [facility], MD   4 months ago Major depressive disorder in partial remission, unspecified whether recurrent [facility])   [place] [facility], MD   9 months ago Primary osteoarthritis of first carpometacarpal joint of right hand   [place] [facility], Ocie Bob, MD   10 months ago Primary osteoarthritis of first carpometacarpal joint of right hand   [place] [facility] at [facility], Ocie Bob, MD       Future Appointments             In 1 month Duanne Limerick, MD [facility] at [facility]

## 2022-11-11 ENCOUNTER — Other Ambulatory Visit: Payer: Self-pay | Admitting: Family Medicine

## 2022-11-11 DIAGNOSIS — R053 Chronic cough: Secondary | ICD-10-CM

## 2022-11-11 MED ORDER — PROMETHAZINE-DM 6.25-15 MG/5ML PO SYRP: 5.0000 mL | ORAL_SOLUTION | Freq: Four times a day (QID) | ORAL | 0 refills | Status: DC | PRN

## 2022-11-11 NOTE — Telephone Encounter (Signed)
Please review.  KP

## 2022-11-16 ENCOUNTER — Other Ambulatory Visit: Payer: Self-pay

## 2022-11-16 DIAGNOSIS — M940 Chondrocostal junction syndrome [Tietze]: Secondary | ICD-10-CM

## 2022-11-16 MED ORDER — MELOXICAM 15 MG PO TABS: 15.0000 mg | ORAL_TABLET | Freq: Every day | ORAL | 0 refills | Status: DC

## 2022-12-13 ENCOUNTER — Other Ambulatory Visit: Payer: Self-pay

## 2022-12-13 DIAGNOSIS — M940 Chondrocostal junction syndrome [Tietze]: Secondary | ICD-10-CM

## 2022-12-13 MED ORDER — MELOXICAM 15 MG PO TABS: 15.0000 mg | ORAL_TABLET | Freq: Every day | ORAL | 0 refills | Status: AC

## 2022-12-16 ENCOUNTER — Other Ambulatory Visit: Payer: Self-pay

## 2022-12-16 DIAGNOSIS — E782 Mixed hyperlipidemia: Secondary | ICD-10-CM

## 2022-12-16 MED ORDER — ROSUVASTATIN CALCIUM 5 MG PO TABS: 5.0000 mg | ORAL_TABLET | Freq: Every day | ORAL | 0 refills | Status: DC

## 2022-12-30 ENCOUNTER — Other Ambulatory Visit: Payer: Self-pay

## 2022-12-30 DIAGNOSIS — K219 Gastro-esophageal reflux disease without esophagitis: Secondary | ICD-10-CM

## 2022-12-30 DIAGNOSIS — J301 Allergic rhinitis due to pollen: Secondary | ICD-10-CM

## 2022-12-30 MED ORDER — MONTELUKAST SODIUM 10 MG PO TABS: 10.0000 mg | ORAL_TABLET | Freq: Every evening | ORAL | 0 refills | Status: DC

## 2022-12-30 MED ORDER — FAMOTIDINE 40 MG PO TABS: 40.0000 mg | ORAL_TABLET | Freq: Every day | ORAL | 1 refills | Status: DC

## 2023-01-06 ENCOUNTER — Ambulatory Visit (INDEPENDENT_AMBULATORY_CARE_PROVIDER_SITE_OTHER): Payer: BC Managed Care – PPO | Admitting: Family Medicine

## 2023-01-06 ENCOUNTER — Encounter: Payer: Self-pay | Admitting: Family Medicine

## 2023-01-06 VITALS — BP 102/62 | HR 78 | Ht 65.0 in | Wt 145.0 lb

## 2023-01-06 DIAGNOSIS — K219 Gastro-esophageal reflux disease without esophagitis: Secondary | ICD-10-CM

## 2023-01-06 DIAGNOSIS — E782 Mixed hyperlipidemia: Secondary | ICD-10-CM

## 2023-01-06 DIAGNOSIS — R058 Other specified cough: Secondary | ICD-10-CM

## 2023-01-06 DIAGNOSIS — J301 Allergic rhinitis due to pollen: Secondary | ICD-10-CM | POA: Diagnosis not present

## 2023-01-06 DIAGNOSIS — R42 Dizziness and giddiness: Secondary | ICD-10-CM

## 2023-01-06 DIAGNOSIS — F324 Major depressive disorder, single episode, in partial remission: Secondary | ICD-10-CM

## 2023-01-06 MED ORDER — MONTELUKAST SODIUM 10 MG PO TABS: 10.0000 mg | ORAL_TABLET | Freq: Every evening | ORAL | 1 refills | Status: AC

## 2023-01-06 MED ORDER — BENZONATATE 100 MG PO CAPS: 100.0000 mg | ORAL_CAPSULE | Freq: Two times a day (BID) | ORAL | 0 refills | Status: DC | PRN

## 2023-01-06 MED ORDER — PANTOPRAZOLE SODIUM 40 MG PO TBEC: DELAYED_RELEASE_TABLET | ORAL | 1 refills | Status: DC

## 2023-01-06 MED ORDER — ROSUVASTATIN CALCIUM 5 MG PO TABS: 5.0000 mg | ORAL_TABLET | Freq: Every day | ORAL | 1 refills | Status: DC

## 2023-01-06 MED ORDER — MECLIZINE HCL 25 MG PO TABS: 25.0000 mg | ORAL_TABLET | Freq: Three times a day (TID) | ORAL | 1 refills | Status: AC | PRN

## 2023-01-06 MED ORDER — CITALOPRAM HYDROBROMIDE 40 MG PO TABS: 40.0000 mg | ORAL_TABLET | Freq: Every day | ORAL | 1 refills | Status: AC

## 2023-01-06 NOTE — Progress Notes (Signed)
Date:  01/06/2023   Name:  Debbie Bell   DOB:  01/04/1961   MRN:  [id number]   Chief Complaint: Gastroesophageal Reflux, Depression, Hyperlipidemia, and Dizziness  Gastroesophageal Reflux She complains of coughing, heartburn and nausea. She reports no abdominal pain, no chest pain, no choking or no dysphagia. acid relux when lying down. This is a chronic problem. The current episode started more than 1 year ago. The problem has been waxing and waning. The heartburn is located in the substernum. The heartburn is of moderate intensity. The heartburn wakes her from sleep. The heartburn changes with position. The symptoms are aggravated by certain foods and ETOH. Pertinent negatives include no fatigue or melena. She has tried a PPI for the symptoms.  Depression      (acid relux when lying down)  This is a chronic problem.  The current episode started more than 1 year ago.   The problem occurs rarely.  The problem has been gradually improving since onset.  Associated symptoms include no decreased concentration, no fatigue, no helplessness, no hopelessness, does not have insomnia, not irritable, no restlessness, no decreased interest, no appetite change, no body aches, no myalgias, no headaches, no indigestion, not sad and no suicidal ideas.     The symptoms are aggravated by nothing.  Past treatments include SSRIs - Selective serotonin reuptake inhibitors.  Previous treatment provided moderate relief. Hyperlipidemia This is a chronic problem. The current episode started more than 1 year ago. The problem is controlled. Recent lipid tests were reviewed and are normal. She has no history of diabetes. Pertinent negatives include no chest pain, focal sensory loss, focal weakness, myalgias or shortness of breath. Current antihyperlipidemic treatment includes statins. The current treatment provides moderate improvement of lipids. There are no compliance problems.   Dizziness This is a chronic problem. The  current episode started more than 1 year ago. The problem has been gradually improving. Associated symptoms include coughing and nausea. Pertinent negatives include no abdominal pain, chest pain, fatigue, headaches or myalgias.    Lab Results  Component Value Date   NA 142 07/07/2022   K 4.4 07/07/2022   CO2 24 07/07/2022   GLUCOSE 78 07/07/2022   BUN 10 07/07/2022   CREATININE 0.80 07/07/2022   CALCIUM 9.6 07/07/2022   EGFR 85 07/07/2022   GFRNONAA 84 04/10/2019   Lab Results  Component Value Date   CHOL 210 (H) 07/07/2022   HDL 48 07/07/2022   LDLCALC 137 (H) 07/07/2022   TRIG 140 07/07/2022   CHOLHDL 3.0 10/23/2015   Lab Results  Component Value Date   TSH 0.803 08/10/2022   No results found for: "HGBA1C" Lab Results  Component Value Date   WBC 6.4 10/19/2018   HGB 14.4 10/19/2018   HCT 41.2 10/19/2018   MCV 90 10/19/2018   PLT 383 10/19/2018   Lab Results  Component Value Date   ALT 13 07/07/2022   AST 20 07/07/2022   ALKPHOS 87 07/07/2022   BILITOT 0.4 07/07/2022   No results found for: "25OHVITD2", "25OHVITD3", "VD25OH"   Review of Systems  Constitutional:  Negative for appetite change and fatigue.  Respiratory:  Positive for cough. Negative for choking, chest tightness and shortness of breath.   Cardiovascular:  Negative for chest pain.  Gastrointestinal:  Positive for heartburn and nausea. Negative for abdominal pain, blood in stool, dysphagia and melena.  Musculoskeletal:  Negative for myalgias.  Neurological:  Positive for dizziness. Negative for focal weakness and headaches.  Psychiatric/Behavioral:  Positive for depression. Negative for decreased concentration and suicidal ideas. The patient does not have insomnia.     Patient Active Problem List   Diagnosis Date Noted   Encounter for screening colonoscopy    Primary osteoarthritis of first carpometacarpal joint of right hand 01/05/2022   Persistent cough 04/10/2019    Allergies  Allergen  Reactions   Penicillins     Past Surgical History:  Procedure Laterality Date   COLONOSCOPY  2015   polyps/ repeat in 2 yrs- Dr Servando Snare   COLONOSCOPY WITH PROPOFOL N/A 02/12/2022   Procedure: COLONOSCOPY WITH PROPOFOL;  Surgeon: Midge Minium, MD;  Location: [facility];  Service: Endoscopy;  Laterality: N/A;   TONSILLECTOMY      Social History   Tobacco Use   Smoking status: Never   Smokeless tobacco: Never  Vaping Use   Vaping status: Never Used  Substance Use Topics   Alcohol use: Yes    Comment: occasionally   Drug use: No     Medication list has been reviewed and updated.  Current Meds  Medication Sig   cetirizine-pseudoephedrine (ZYRTEC-D) 5-120 MG tablet Take 1 tablet by mouth 2 (two) times daily.   citalopram (CELEXA) 40 MG tablet Take 1 tablet (40 mg total) by mouth daily.   desoximetasone (TOPICORT) 0.25 % cream Apply bid prn   Flaxseed, Linseed, (FLAX SEED OIL PO) Take by mouth daily.   hydrocortisone (ANUSOL-HC) 2.5 % rectal cream Place 1 Application rectally 2 (two) times daily. Place 1 application rectally 2 (two) times daily.   meclizine (ANTIVERT) 25 MG tablet Take 1 tablet (25 mg total) by mouth 3 (three) times daily as needed for dizziness.   meloxicam (MOBIC) 15 MG tablet Take 1 tablet (15 mg total) by mouth daily.   montelukast (SINGULAIR) 10 MG tablet Take 1 tablet (10 mg total) by mouth every evening.   nystatin-triamcinolone (MYCOLOG II) cream Apply topically 2 (two) times daily.   pantoprazole (PROTONIX) 40 MG tablet TAKE 1 TABLET BY MOUTH EVERY DAY   rosuvastatin (CRESTOR) 5 MG tablet Take 1 tablet (5 mg total) by mouth daily.   [DISCONTINUED] famotidine (PEPCID) 40 MG tablet Take 1 tablet (40 mg total) by mouth daily.       01/06/2023    8:30 AM 11/02/2022    1:42 PM 08/10/2022    3:10 PM 07/07/2022    8:37 AM  GAD 7 : Generalized Anxiety Score  Nervous, Anxious, on Edge 0 0 0 0  Control/stop worrying 0 0 0 0  Worry too much - different  things 0 0 0 0  Trouble relaxing 0 0 0 0  Restless 0 0 0 0  Easily annoyed or irritable 0 0 0 0  Afraid - awful might happen 0 0 0 0  Total GAD 7 Score 0 0 0 0  Anxiety Difficulty Not difficult at all Not difficult at all Not difficult at all Not difficult at all       01/06/2023    8:29 AM 11/02/2022    1:42 PM 08/10/2022    3:10 PM  Depression screen PHQ 2/9  Decreased Interest 0 0 0  Down, Depressed, Hopeless 0 0 0  PHQ - 2 Score 0 0 0  Altered sleeping 0 0 0  Tired, decreased energy 0 0 0  Change in appetite 0 0 0  Feeling bad or failure about yourself  0 0 0  Trouble concentrating 0 0 0  Moving slowly or fidgety/restless 0  0 0  Suicidal thoughts 0 0 0  PHQ-9 Score 0 0 0  Difficult doing work/chores Not difficult at all Not difficult at all Not difficult at all    BP Readings from Last 3 Encounters:  01/06/23 102/62  11/02/22 124/76  08/10/22 100/70    Physical Exam Vitals and nursing note reviewed. Exam conducted with a chaperone present.  Constitutional:      General: She is not irritable.She is not in acute distress.    Appearance: She is not diaphoretic.  HENT:     Head: Normocephalic and atraumatic.     Right Ear: External ear normal.     Left Ear: External ear normal.     Nose: Nose normal.  Eyes:     General:        Right eye: No discharge.        Left eye: No discharge.     Conjunctiva/sclera: Conjunctivae normal.     Pupils: Pupils are equal, round, and reactive to light.  Neck:     Thyroid: No thyromegaly.     Vascular: No JVD.  Cardiovascular:     Rate and Rhythm: Normal rate and regular rhythm.     Heart sounds: Normal heart sounds, S1 normal and S2 normal. No murmur heard.    No systolic murmur is present.     No diastolic murmur is present.     No friction rub. No gallop. No S3 or S4 sounds.  Pulmonary:     Effort: Pulmonary effort is normal.     Breath sounds: Normal breath sounds. No decreased breath sounds, wheezing, rhonchi or rales.   Abdominal:     General: Bowel sounds are normal.     Palpations: Abdomen is soft. There is no mass.     Tenderness: There is no abdominal tenderness. There is no guarding.  Musculoskeletal:        General: Normal range of motion.     Cervical back: Normal range of motion and neck supple.     Right lower leg: No edema.     Left lower leg: No edema.  Lymphadenopathy:     Cervical: No cervical adenopathy.  Skin:    General: Skin is warm and dry.  Neurological:     Mental Status: She is alert.     Deep Tendon Reflexes: Reflexes are normal and symmetric.     Wt Readings from Last 3 Encounters:  01/06/23 145 lb (65.8 kg)  11/02/22 150 lb (68 kg)  08/10/22 154 lb (69.9 kg)    BP 102/62   Pulse 78   Ht 5\' 5"  (1.651 m)   Wt 145 lb (65.8 kg)   SpO2 96%   BMI 24.13 kg/m   Assessment and Plan: 1. Major depressive disorder in partial remission, unspecified whether recurrent (HCC) Chronic.  Controlled.  Stable.  Tolerating current medication well.  PHQ is 0.  GAD score 0.  Continue Celexa 40 mg once a day.  Will recheck in 6 months. - citalopram (CELEXA) 40 MG tablet; Take 1 tablet (40 mg total) by mouth daily.  Dispense: 90 tablet; Refill: 1  2. Gastroesophageal reflux disease, unspecified whether esophagitis present Chronic.  Uncontrolled.  Stable.  Have breakthrough reflux particularly at night.  Currently is on pantoprazole 40 mg with an occasional famotidine in the evening for breakthrough.  Of also suggested that she may 1 including an acid for breakthrough as well we will refer to gastroenterology as that she has seen Dr. Renaldo Reel in  the past and there has been some discussion on the possibility of having to do endoscopy in the past. - pantoprazole (PROTONIX) 40 MG tablet; TAKE 1 TABLET BY MOUTH EVERY DAY  Dispense: 90 tablet; Refill: 1 - Ambulatory referral to Gastroenterology  3. Vertigo Chronic.  Controlled.  Stable.  Continue meclizine 25 mg 3 times a day as needed -  meclizine (ANTIVERT) 25 MG tablet; Take 1 tablet (25 mg total) by mouth 3 (three) times daily as needed for dizziness.  Dispense: 30 tablet; Refill: 1  4. Seasonal allergic rhinitis due to pollen Chronic.  Controlled.  Stable.  Have suggested using the Singulair 10 mg particularly during the pollen seasons. - montelukast (SINGULAIR) 10 MG tablet; Take 1 tablet (10 mg total) by mouth every evening.  Dispense: 90 tablet; Refill: 1  5. Mixed hyperlipidemia Chronic.  Controlled.  Stable.  Continue Crestor 5 mg once a day.  Will check lipid panel as well as renal panel for current level of control. - rosuvastatin (CRESTOR) 5 MG tablet; Take 1 tablet (5 mg total) by mouth daily.  Dispense: 90 tablet; Refill: 1 - Renal Function Panel - Lipid Panel With LDL/HDL Ratio     Elizabeth Sauer, MD

## 2023-01-06 NOTE — Addendum Note (Signed)
Addended by: Everitt Amber on: 01/06/2023 10:16 AM   Modules accepted: Orders

## 2023-01-07 NOTE — Progress Notes (Signed)
PC top pt discussed labs, pt voiced understanding.

## 2023-01-15 IMAGING — CR DG ANKLE COMPLETE 3+V*L*
3 series · 3 of 3 positions shown · non-contrast
Comparison: None.

CLINICAL DATA: Ankle pain

EXAM:
LEFT ANKLE COMPLETE - 3+ VIEW

[ankle ap]
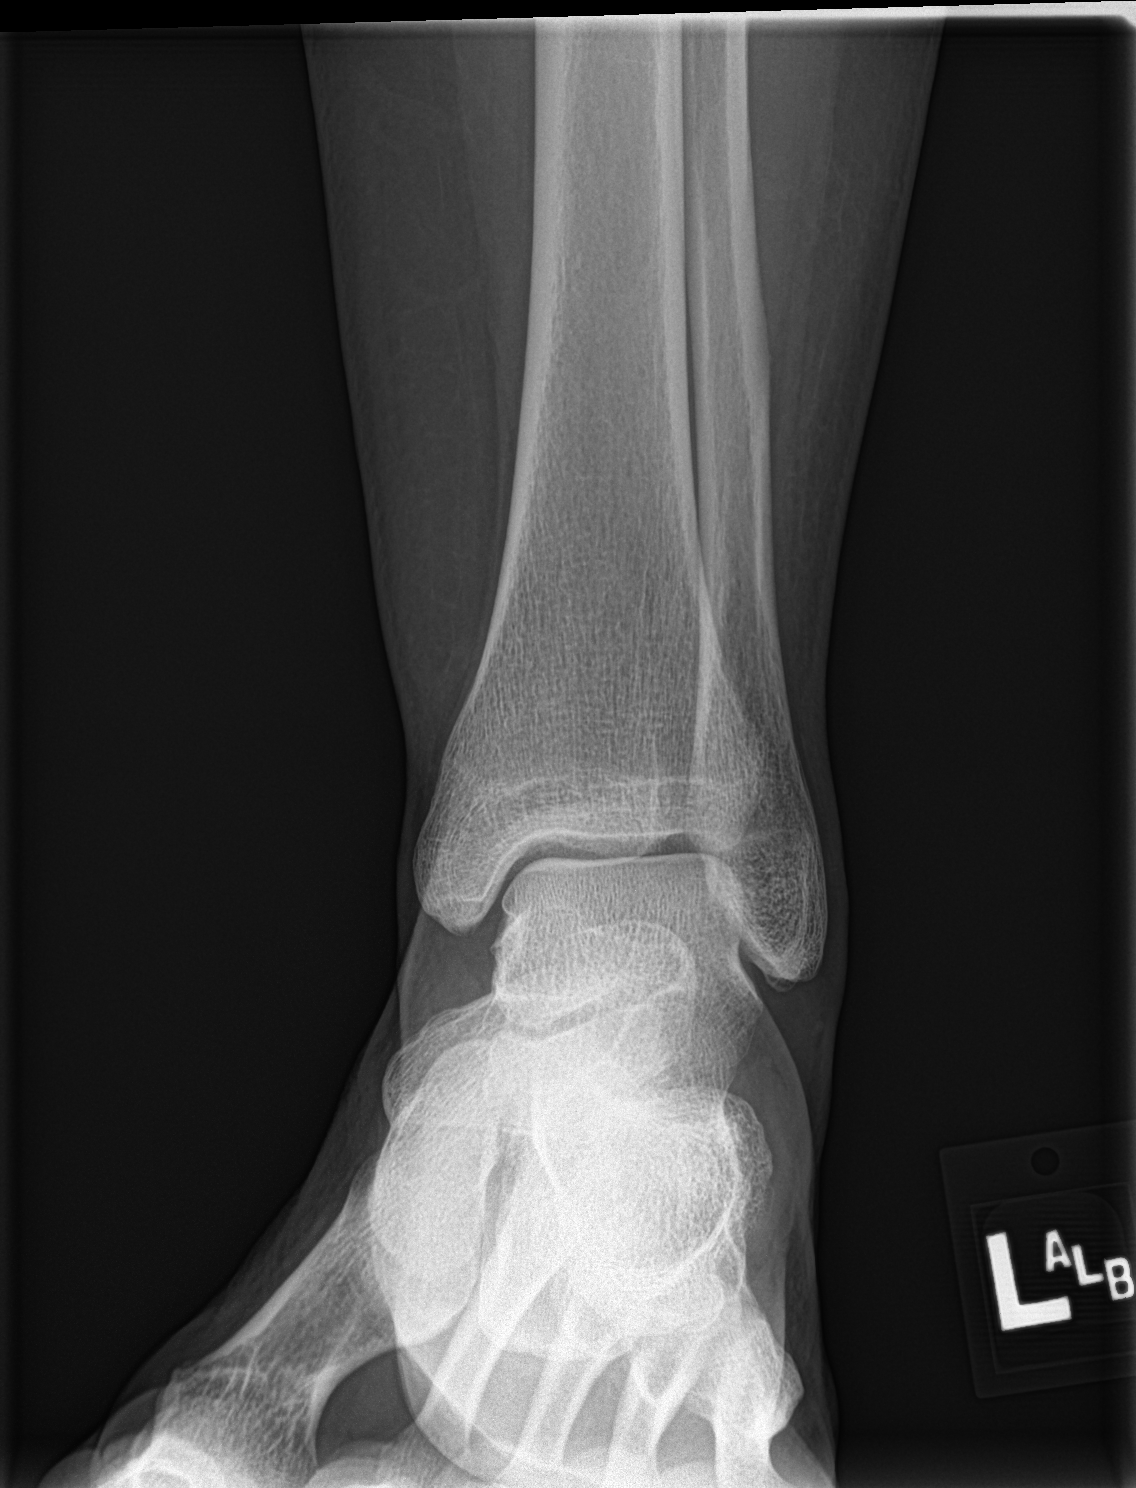

[ankle obl]
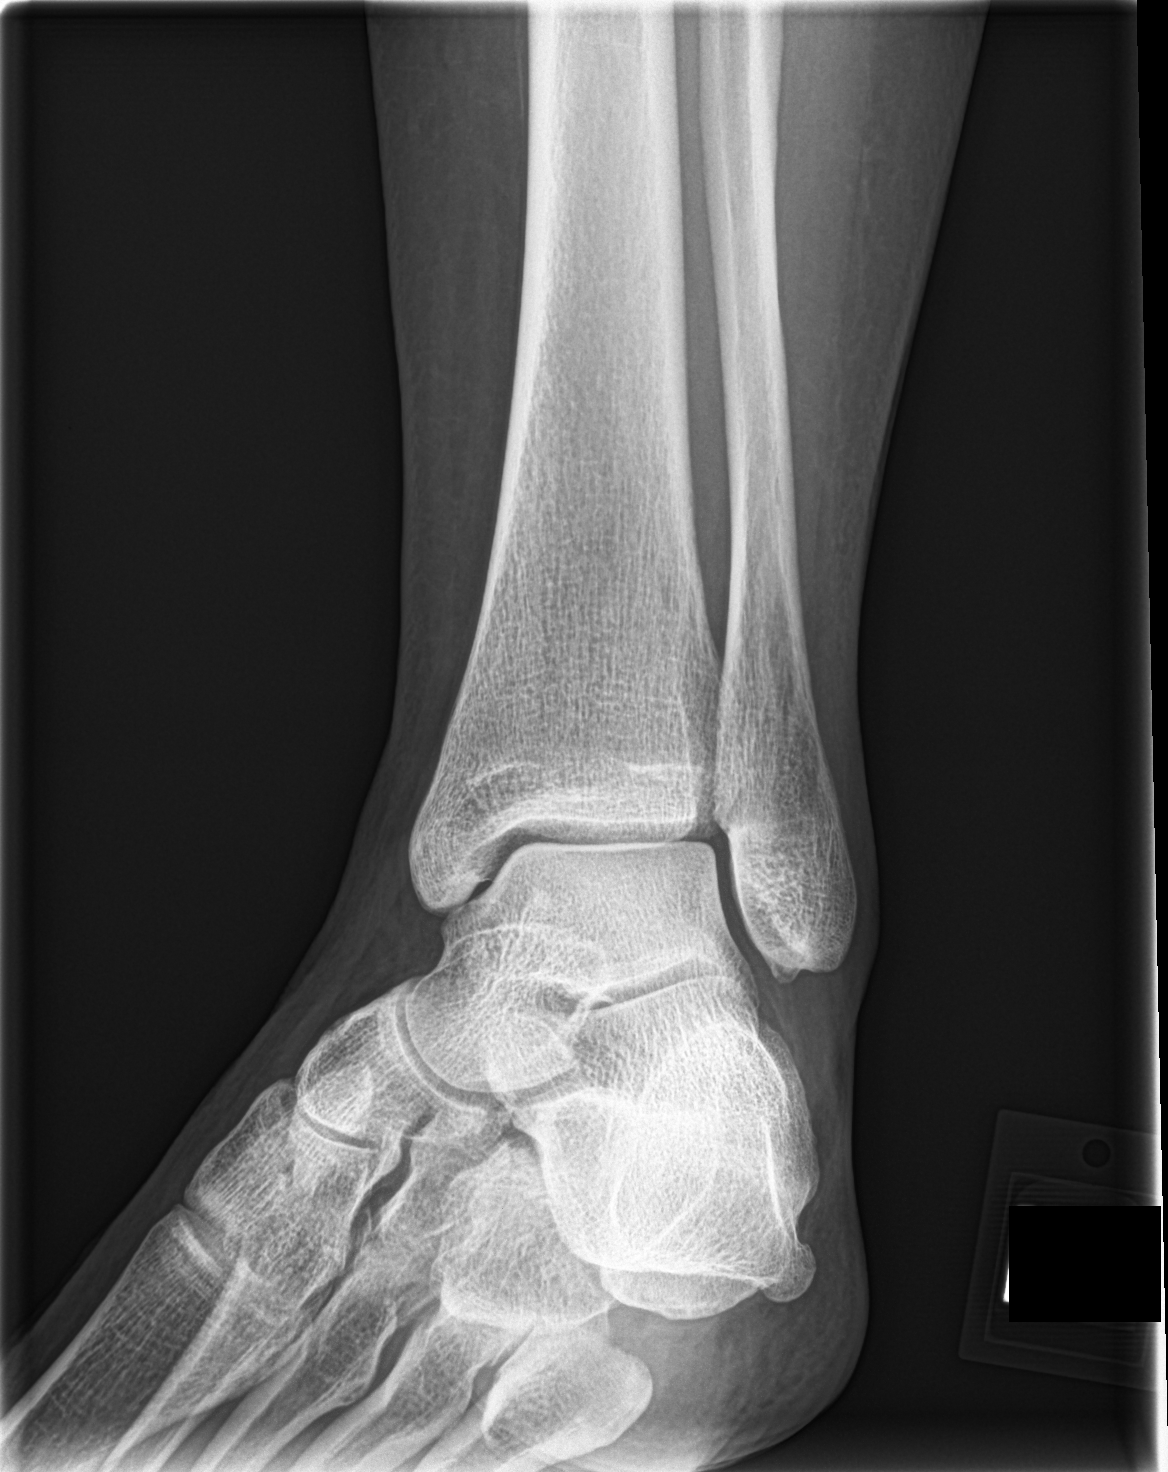

[ankle lat]
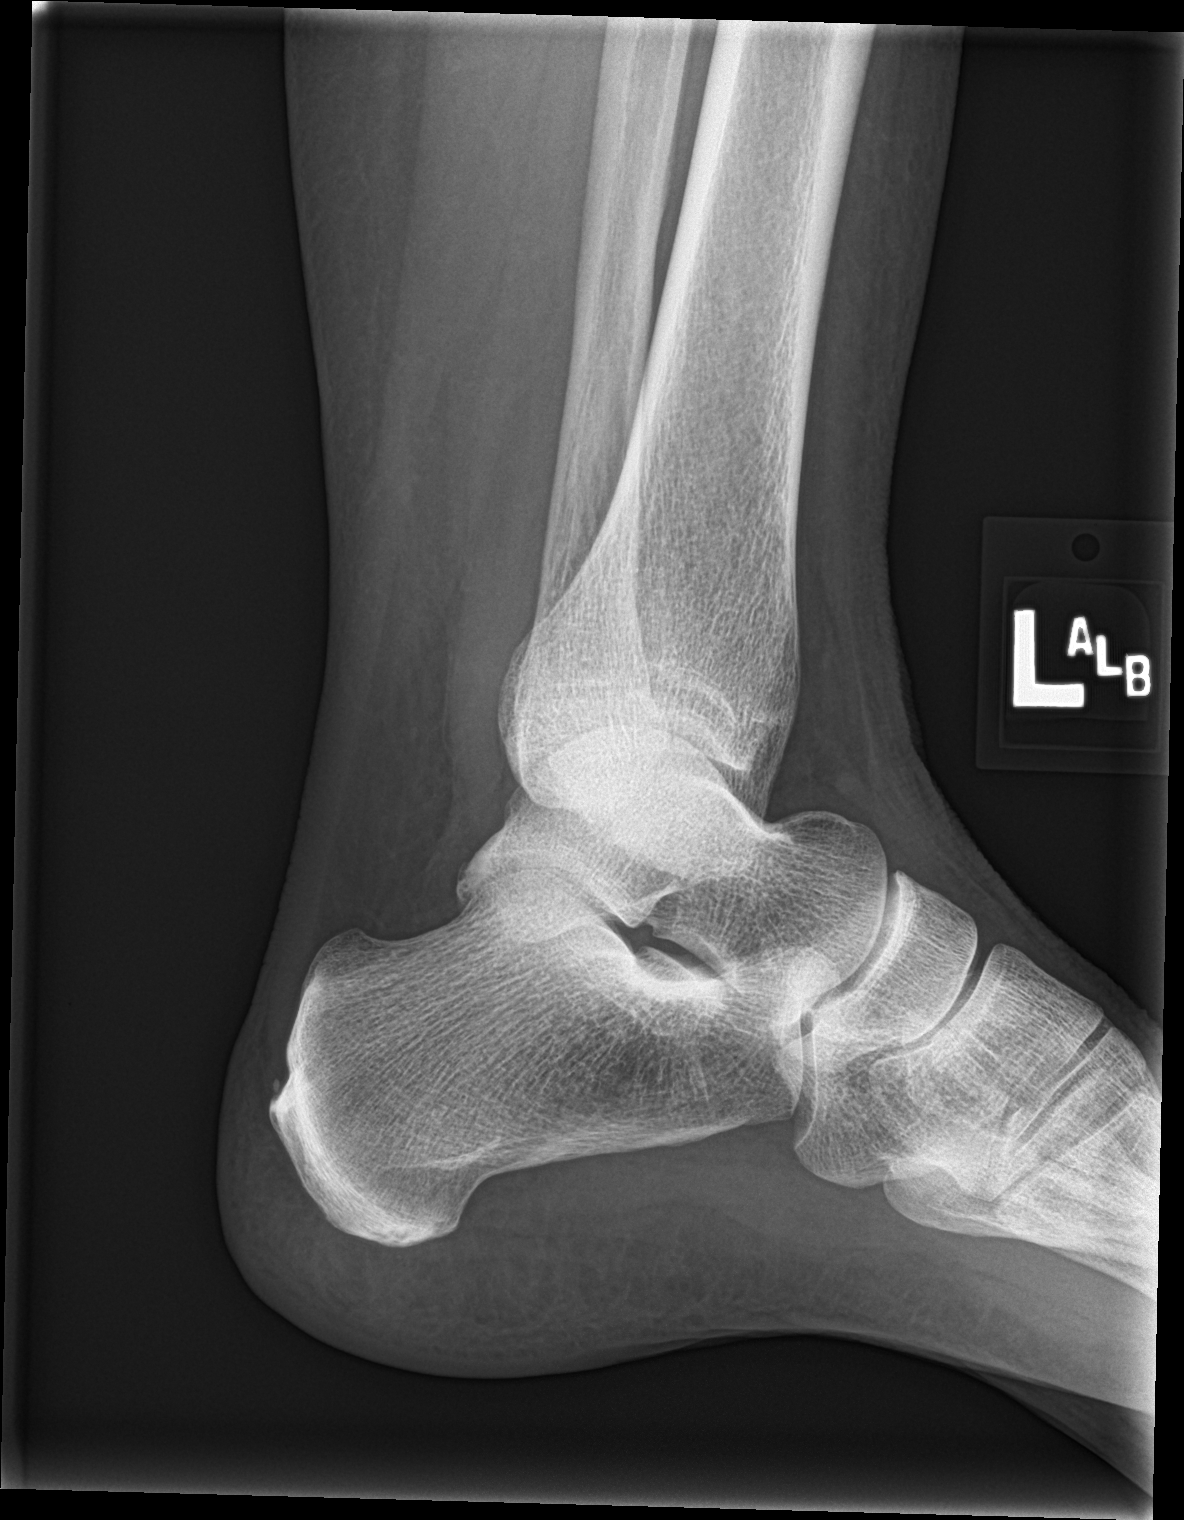

[3 of 3 positions shown; findings below may reference images not displayed]

FINDINGS: There is no evidence of fracture, dislocation, or joint effusion.
There is no evidence of arthropathy or other focal bone abnormality.
Soft tissues are unremarkable.
IMPRESSION: Negative.

## 2023-03-07 ENCOUNTER — Ambulatory Visit (INDEPENDENT_AMBULATORY_CARE_PROVIDER_SITE_OTHER): Payer: BC Managed Care – PPO | Admitting: Gastroenterology

## 2023-03-07 ENCOUNTER — Encounter: Payer: Self-pay | Admitting: Gastroenterology

## 2023-03-07 VITALS — BP 136/64 | HR 93 | Temp 98.2°F | Ht 65.0 in | Wt 150.0 lb

## 2023-03-07 DIAGNOSIS — K219 Gastro-esophageal reflux disease without esophagitis: Secondary | ICD-10-CM

## 2023-03-07 NOTE — Progress Notes (Signed)
Primary Care Physician: Duanne Limerick, MD  Primary Gastroenterologist:  Dr. Midge Minium  Chief Complaint  Patient presents with   New Patient (Initial Visit)   Gastroesophageal Reflux    Has tried Famotidine , has tried omeprazole and Nexium with minimal to no relief...  Pt reports she still has Sx on Pantoprazole    HPI: Debbie Bell is a 62 y.o. female here after seeing me in the past for colonoscopy in 2023.  The patient had seen her primary care provider at the beginning of August with a report of coughing heartburn and nausea.  There is no report of any chest pain or abdominal pain.  There is acid reflux when she would lie down and had reported that there is no choking or dysphagia.  The patient had endorsed the heartburn waking her up from sleep.  The symptoms were aggravated by certain foods and alcohol.  The patient has tried PPIs for the symptoms. The patient has had longstanding heartburn and denies ever having an upper endoscopy in the past.  The patient has been taking famotidine at night and has tried omeprazole and esomeprazole without relief so she is now back on the pantoprazole.  The patient take the pantoprazole in the morning and most of her acid breakthrough is at night despite taking an H2 blocker.  Past Medical History:  Diagnosis Date   Allergy    Arthritis    Depression    GERD (gastroesophageal reflux disease)    Hemorrhoid     Current Outpatient Medications  Medication Sig Dispense Refill   cetirizine-pseudoephedrine (ZYRTEC-D) 5-120 MG tablet Take 1 tablet by mouth 2 (two) times daily. 180 tablet 1   citalopram (CELEXA) 40 MG tablet Take 1 tablet (40 mg total) by mouth daily. 90 tablet 1   desoximetasone (TOPICORT) 0.25 % cream Apply bid prn 30 g 1   Flaxseed, Linseed, (FLAX SEED OIL PO) Take by mouth daily.     hydrocortisone (ANUSOL-HC) 2.5 % rectal cream Place 1 Application rectally 2 (two) times daily. Place 1 application rectally 2 (two) times  daily. 30 g 0   meclizine (ANTIVERT) 25 MG tablet Take 1 tablet (25 mg total) by mouth 3 (three) times daily as needed for dizziness. 30 tablet 1   meloxicam (MOBIC) 15 MG tablet Take 1 tablet (15 mg total) by mouth daily. 30 tablet 0   montelukast (SINGULAIR) 10 MG tablet Take 1 tablet (10 mg total) by mouth every evening. 90 tablet 1   nystatin-triamcinolone (MYCOLOG II) cream Apply topically 2 (two) times daily. 30 g 0   pantoprazole (PROTONIX) 40 MG tablet TAKE 1 TABLET BY MOUTH EVERY DAY 90 tablet 1   rosuvastatin (CRESTOR) 5 MG tablet Take 1 tablet (5 mg total) by mouth daily. 90 tablet 1   No current facility-administered medications for this visit.    Allergies as of 03/07/2023 - Review Complete 03/07/2023  Allergen Reaction Noted   Penicillins  10/23/2015    ROS:  General: Negative for anorexia, weight loss, fever, chills, fatigue, weakness. ENT: Negative for hoarseness, difficulty swallowing , nasal congestion. CV: Negative for chest pain, angina, palpitations, dyspnea on exertion, peripheral edema.  Respiratory: Negative for dyspnea at rest, dyspnea on exertion, cough, sputum, wheezing.  GI: See history of present illness. GU:  Negative for dysuria, hematuria, urinary incontinence, urinary frequency, nocturnal urination.  Endo: Negative for unusual weight change.    Physical Examination:   BP 136/64 (BP Location: Left Arm, Patient Position:  Sitting, Cuff Size: Normal)   Pulse 93   Temp 98.2 F (36.8 C) (Oral)   Ht 5\' 5"  (1.651 m)   Wt 150 lb (68 kg)   BMI 24.96 kg/m   General: Well-nourished, well-developed in no acute distress.  Eyes: No icterus. Conjunctivae pink. Neuro: Alert and oriented x 3.  Grossly intact. Skin: Warm and dry, no jaundice.   Psych: Alert and cooperative, normal mood and affect.  Labs:    Imaging Studies: No results found.  Assessment and Plan:   Debbie Bell is a 62 y.o. y/o female who comes in today with a history of GERD with  acid breakthrough and no upper endoscopy in the past.  The patient will be set up for an EGD due to her longstanding GERD and failure to respond completely to her medication.  The patient has been told to take the pantoprazole in the evening thereby giving her the highest concentration of the medication and her blood when she lays down at night.  The patient has also been offered a referral for antireflux surgery for which she states her sister had the same surgery and had done very well.  She has also been told that if the medication at night does not help her symptoms that she can go on to the pantoprazole twice a day.  The patient has been explained the plan and agrees with it.   Midge Minium, MD. Clementeen Graham    Note: This dictation was prepared with Dragon dictation along with smaller phrase technology. Any transcriptional errors that result from this process are unintentional.

## 2023-03-10 ENCOUNTER — Encounter: Payer: Self-pay | Admitting: Gastroenterology

## 2023-03-14 ENCOUNTER — Encounter: Payer: Self-pay | Admitting: Gastroenterology

## 2023-03-14 NOTE — Anesthesia Preprocedure Evaluation (Addendum)
Anesthesia Evaluation  Patient identified by MRN, date of birth, ID band Patient awake    Reviewed: Allergy & Precautions, H&P , NPO status , Patient's Chart, lab work & pertinent test results  Airway Mallampati: IV  TM Distance: <3 FB Neck ROM: Full   Comment: Very short TMD, says she has sleep apnea, but states does not need CPAP Dental no notable dental hx.    Pulmonary neg pulmonary ROS, sleep apnea    Pulmonary exam normal breath sounds clear to auscultation       Cardiovascular negative cardio ROS Normal cardiovascular exam Rhythm:Regular Rate:Normal     Neuro/Psych  PSYCHIATRIC DISORDERS  Depression    negative neurological ROS  negative psych ROS   GI/Hepatic Neg liver ROS,GERD  ,,  Endo/Other  negative endocrine ROS    Renal/GU negative Renal ROS  negative genitourinary   Musculoskeletal negative musculoskeletal ROS (+) Arthritis ,    Abdominal   Peds negative pediatric ROS (+)  Hematology negative hematology ROS (+)   Anesthesia Other Findings Allergy  Hemorrhoid GERD (gastroesophageal reflux disease) Depression Arthritis  Sleep apnea    Reproductive/Obstetrics negative OB ROS                             Anesthesia Physical Anesthesia Plan  ASA: 2  Anesthesia Plan: General   Post-op Pain Management:    Induction: Intravenous  PONV Risk Score and Plan:   Airway Management Planned: Natural Airway and Nasal Cannula  Additional Equipment:   Intra-op Plan:   Post-operative Plan:   Informed Consent: I have reviewed the patients History and Physical, chart, labs and discussed the procedure including the risks, benefits and alternatives for the proposed anesthesia with the patient or authorized representative who has indicated his/her understanding and acceptance.     Dental Advisory Given  Plan Discussed with: Anesthesiologist, CRNA and Surgeon  Anesthesia  Plan Comments: (Patient consented for risks of anesthesia including but not limited to:  - adverse reactions to medications - risk of airway placement if required - damage to eyes, teeth, lips or other oral mucosa - nerve damage due to positioning  - sore throat or hoarseness - Damage to heart, brain, nerves, lungs, other parts of body or loss of life  Patient voiced understanding and assent.)        Anesthesia Quick Evaluation

## 2023-03-21 ENCOUNTER — Ambulatory Visit
Admission: RE | Admit: 2023-03-21 | Discharge: 2023-03-21 | Disposition: A | Discharge status: Home / Self Care | Payer: BC Managed Care – PPO | Attending: Gastroenterology | Admitting: Gastroenterology

## 2023-03-21 ENCOUNTER — Encounter
Admission: RE | Disposition: A | Discharge status: Home / Self Care | Payer: Self-pay | Source: Home / Self Care | Attending: Gastroenterology

## 2023-03-21 ENCOUNTER — Ambulatory Visit: Payer: BC Managed Care – PPO | Admitting: Anesthesiology

## 2023-03-21 ENCOUNTER — Encounter: Payer: Self-pay | Admitting: Gastroenterology

## 2023-03-21 ENCOUNTER — Other Ambulatory Visit: Payer: Self-pay

## 2023-03-21 DIAGNOSIS — K2100 Gastro-esophageal reflux disease with esophagitis, without bleeding: Secondary | ICD-10-CM | POA: Diagnosis not present

## 2023-03-21 DIAGNOSIS — K449 Diaphragmatic hernia without obstruction or gangrene: Secondary | ICD-10-CM | POA: Insufficient documentation

## 2023-03-21 DIAGNOSIS — M1990 Unspecified osteoarthritis, unspecified site: Secondary | ICD-10-CM | POA: Insufficient documentation

## 2023-03-21 DIAGNOSIS — Z79899 Other long term (current) drug therapy: Secondary | ICD-10-CM | POA: Diagnosis not present

## 2023-03-21 DIAGNOSIS — G4730 Sleep apnea, unspecified: Secondary | ICD-10-CM | POA: Diagnosis not present

## 2023-03-21 DIAGNOSIS — H449 Unspecified disorder of globe: Secondary | ICD-10-CM | POA: Diagnosis not present

## 2023-03-21 DIAGNOSIS — F32A Depression, unspecified: Secondary | ICD-10-CM | POA: Diagnosis not present

## 2023-03-21 DIAGNOSIS — R12 Heartburn: Secondary | ICD-10-CM | POA: Diagnosis not present

## 2023-03-21 DIAGNOSIS — Z791 Long term (current) use of non-steroidal anti-inflammatories (NSAID): Secondary | ICD-10-CM | POA: Diagnosis not present

## 2023-03-21 HISTORY — DX: Other seasonal allergic rhinitis: J30.2

## 2023-03-21 HISTORY — PX: ESOPHAGOGASTRODUODENOSCOPY (EGD) WITH PROPOFOL: SHX5813

## 2023-03-21 HISTORY — DX: Sleep apnea, unspecified: G47.30

## 2023-03-21 SURGERY — ESOPHAGOGASTRODUODENOSCOPY (EGD) WITH PROPOFOL
Anesthesia: General | Site: Mouth

## 2023-03-21 MED ORDER — PROPOFOL 10 MG/ML IV BOLUS
INTRAVENOUS | Status: DC | PRN
  Administered 2023-03-21: 100 mg via INTRAVENOUS
  Administered 2023-03-21: 30 mg via INTRAVENOUS

## 2023-03-21 MED ORDER — LIDOCAINE HCL (PF) 2 % IJ SOLN
INTRAMUSCULAR | Status: AC
  Filled 2023-03-21: qty 5

## 2023-03-21 MED ORDER — STERILE WATER FOR IRRIGATION IR SOLN
Status: DC | PRN
  Administered 2023-03-21: 50 mL

## 2023-03-21 MED ORDER — LIDOCAINE 2% (20 MG/ML) 5 ML SYRINGE
INTRAMUSCULAR | Status: DC | PRN
  Administered 2023-03-21: 100 mg via INTRAVENOUS

## 2023-03-21 SURGICAL SUPPLY — 7 items
BLOCK BITE 60FR ADLT L/F GRN (MISCELLANEOUS) ×1 IMPLANT
GOWN CVR UNV OPN BCK APRN NK (MISCELLANEOUS) ×2 IMPLANT
GOWN ISOL THUMB LOOP REG UNIV (MISCELLANEOUS) ×2
KIT PRC NS LF DISP ENDO (KITS) ×1 IMPLANT
KIT PROCEDURE OLYMPUS (KITS) ×1
MANIFOLD NEPTUNE II (INSTRUMENTS) ×1 IMPLANT
WATER STERILE IRR 250ML POUR (IV SOLUTION) ×1 IMPLANT

## 2023-03-21 NOTE — Op Note (Signed)
[facility] Patient Name: Debbie Bell Procedure Date: 03/21/2023 10:04 AM MRN: [id number] Account #: [id number] Date of Birth: July 19, 1960 Admit Type: Outpatient Age: 62 Room: [facility] OR ROOM 01 Gender: Female Note Status: Finalized Instrument Name: [id number] Procedure:             Upper GI endoscopy Indications:           Heartburn Providers:             Midge Minium MD, MD Referring MD:          Duanne Limerick, MD (Referring MD) Medicines:             Propofol per Anesthesia Complications:         No immediate complications. Procedure:             Pre-Anesthesia Assessment:                        - Prior to the procedure, a History and Physical was                         performed, and patient medications and allergies were                         reviewed. The patient's tolerance of previous                         anesthesia was also reviewed. The risks and benefits                         of the procedure and the sedation options and risks                         were discussed with the patient. All questions were                         answered, and informed consent was obtained. Prior                         Anticoagulants: The patient has taken no anticoagulant                         or antiplatelet agents. ASA Grade Assessment: II - A                         patient with mild systemic disease. After reviewing                         the risks and benefits, the patient was deemed in                         satisfactory condition to undergo the procedure.                        After obtaining informed consent, the endoscope was                         passed under direct vision. Throughout the procedure,  the patient's blood pressure, pulse, and oxygen                         saturations were monitored continuously. The was                         introduced through the mouth, and advanced to the                          second part of duodenum. The upper GI endoscopy was                         accomplished without difficulty. The patient tolerated                         the procedure well. Findings:      LA Grade C (one or more mucosal breaks continuous between tops of 2 or       more mucosal folds, less than 75% circumference) esophagitis with no       bleeding was found in the lower third of the esophagus.      A 6 cm hiatal hernia was present.      The stomach was normal.      The examined duodenum was normal. Impression:            - LA Grade C reflux esophagitis with no bleeding.                        - 6 cm hiatal hernia.                        - Normal stomach.                        - Normal examined duodenum.                        - No specimens collected. Recommendation:        - Discharge patient to home.                        - Resume previous diet.                        - Continue present medications. Procedure Code(s):     --- Professional ---                        [phone], Esophagogastroduodenoscopy, flexible,                         transoral; diagnostic, including collection of                         specimen(s) by brushing or washing, when performed                         (separate procedure) Diagnosis Code(s):     --- Professional ---                        K21.00, Gastro-esophageal reflux disease with  esophagitis, without bleeding CPT copyright 2022 American Medical Association. All rights reserved. The codes documented in this report are preliminary and upon coder review may  be revised to meet current compliance requirements. Midge Minium MD, MD 03/21/2023 10:20:06 AM This report has been signed electronically. Number of Addenda: 0 Note Initiated On: 03/21/2023 10:04 AM Total Procedure Duration: 0 hours 2 minutes 28 seconds  Estimated Blood Loss:  Estimated blood loss: none.      [facility]

## 2023-03-21 NOTE — H&P (Signed)
Midge Minium, MD [facility] [address] [place], [place] [address] Phone:[phone] Fax : [phone]  Primary Care Physician:  Duanne Limerick, MD Primary Gastroenterologist:  Dr. Servando Snare  Pre-Procedure History & Physical: HPI:  Debbie Bell is a 62 y.o. female is here for an endoscopy.   Past Medical History:  Diagnosis Date   Allergy    Arthritis    Depression    GERD (gastroesophageal reflux disease)    Hemorrhoid    Seasonal allergic rhinitis    Sleep apnea    no CPAP    Past Surgical History:  Procedure Laterality Date   COLONOSCOPY  2015   polyps/ repeat in 2 yrs- Dr Servando Snare   COLONOSCOPY WITH PROPOFOL N/A 02/12/2022   Procedure: COLONOSCOPY WITH PROPOFOL;  Surgeon: Midge Minium, MD;  Location: [facility];  Service: Endoscopy;  Laterality: N/A;   TONSILLECTOMY      Prior to Admission medications   Medication Sig Start Date End Date Taking? Authorizing Provider  cetirizine-pseudoephedrine (ZYRTEC-D) 5-120 MG tablet Take 1 tablet by mouth 2 (two) times daily. 01/04/22  Yes Duanne Limerick, MD  citalopram (CELEXA) 40 MG tablet Take 1 tablet (40 mg total) by mouth daily. 01/06/23  Yes Duanne Limerick, MD  desoximetasone (TOPICORT) 0.25 % cream Apply bid prn 02/21/20  Yes Duanne Limerick, MD  Flaxseed, Linseed, (FLAX SEED OIL PO) Take by mouth daily.   Yes [provider]  hydrocortisone (ANUSOL-HC) 2.5 % rectal cream Place 1 Application rectally 2 (two) times daily. Place 1 application rectally 2 (two) times daily. 10/28/22  Yes Duanne Limerick, MD  meclizine (ANTIVERT) 25 MG tablet Take 1 tablet (25 mg total) by mouth 3 (three) times daily as needed for dizziness. 01/06/23  Yes Duanne Limerick, MD  meloxicam (MOBIC) 15 MG tablet Take 1 tablet (15 mg total) by mouth daily. 12/13/22  Yes Duanne Limerick, MD  montelukast (SINGULAIR) 10 MG tablet Take 1 tablet (10 mg total) by mouth every evening. 01/06/23  Yes Duanne Limerick, MD  nystatin-triamcinolone  (MYCOLOG II) cream Apply topically 2 (two) times daily. 10/28/22  Yes Duanne Limerick, MD  pantoprazole (PROTONIX) 40 MG tablet TAKE 1 TABLET BY MOUTH EVERY DAY 01/06/23  Yes Duanne Limerick, MD  rosuvastatin (CRESTOR) 5 MG tablet Take 1 tablet (5 mg total) by mouth daily. 01/06/23  Yes Duanne Limerick, MD    Allergies as of 03/07/2023 - Review Complete 03/07/2023  Allergen Reaction Noted   Penicillins  10/23/2015    Family History  Problem Relation Age of Onset   Heart disease Father    Cancer Paternal Aunt    Breast cancer Paternal Aunt 42   Stroke Maternal Grandmother     Social History   Socioeconomic History   Marital status: Single    Spouse name: Not on file   Number of children: Not on file   Years of education: Not on file   Highest education level: Some college, no degree  Occupational History   Not on file  Tobacco Use   Smoking status: Never   Smokeless tobacco: Never  Vaping Use   Vaping status: Never Used  Substance and Sexual Activity   Alcohol use: Yes    Comment: occasionally   Drug use: No   Sexual activity: Yes  Other Topics Concern   Not on file  Social History Narrative   Not on file   Social Determinants of Health   Financial  Resource Strain: Low Risk  (11/02/2022)   Overall Financial Resource Strain (CARDIA)    Difficulty of Paying Living Expenses: Not hard at all  Food Insecurity: No Food Insecurity (11/02/2022)   Hunger Vital Sign    Worried About Running Out of Food in the Last Year: Never true    Ran Out of Food in the Last Year: Never true  Transportation Needs: No Transportation Needs (11/02/2022)   PRAPARE - Administrator, Civil Service (Medical): No    Lack of Transportation (Non-Medical): No  Physical Activity: Unknown (11/02/2022)   Exercise Vital Sign    Days of Exercise per Week: 0 days    Minutes of Exercise per Session: Not on file  Stress: No Stress Concern Present (11/02/2022)   [employer] of Occupational  Health - Occupational Stress Questionnaire    Feeling of Stress : Not at all  Social Connections: Moderately Integrated (11/02/2022)   Social Connection and Isolation Panel [NHANES]    Frequency of Communication with Friends and Family: Three times a week    Frequency of Social Gatherings with Friends and Family: Once a week    Attends Religious Services: More than 4 times per year    Active Member of [employer] or Organizations: No    Attends Engineer, structural: Not on file    Marital Status: Living with partner  Intimate Partner Violence: Not on file    Review of Systems: See HPI, otherwise negative ROS  Physical Exam: BP 115/78   Pulse 87   Temp 98 F (36.7 C) (Temporal)   Resp 18   Ht 5\' 5"  (1.651 m)   Wt 68.5 kg   SpO2 97%   BMI 25.13 kg/m  General:   Alert,  pleasant and cooperative in NAD Head:  Normocephalic and atraumatic. Neck:  Supple; no masses or thyromegaly. Lungs:  Clear throughout to auscultation.    Heart:  Regular rate and rhythm. Abdomen:  Soft, nontender and nondistended. Normal bowel sounds, without guarding, and without rebound.   Neurologic:  Alert and  oriented x4;  grossly normal neurologically.  Impression/Plan: Debbie Bell is here for an endoscopy to be performed for GERD  Risks, benefits, limitations, and alternatives regarding  endoscopy have been reviewed with the patient.  Questions have been answered.  All parties agreeable.   Midge Minium, MD  03/21/2023, 10:05 AM

## 2023-03-21 NOTE — Anesthesia Postprocedure Evaluation (Signed)
Anesthesia Post Note  Patient: Debbie Bell  Procedure(s) Performed: ESOPHAGOGASTRODUODENOSCOPY (EGD) WITH PROPOFOL (Mouth)  Patient location during evaluation: PACU Anesthesia Type: General Level of consciousness: awake and alert Pain management: pain level controlled Vital Signs Assessment: post-procedure vital signs reviewed and stable Respiratory status: spontaneous breathing, nonlabored ventilation, respiratory function stable and patient connected to nasal cannula oxygen Cardiovascular status: blood pressure returned to baseline and stable Postop Assessment: no apparent nausea or vomiting Anesthetic complications: no   No notable events documented.   Last Vitals:  Vitals:   03/21/23 1030 03/21/23 1035  BP: 121/82 (!) 119/102  Pulse: 97 100  Resp: 19 15  Temp:  36.7 C  SpO2: 96% 98%    Last Pain:  Vitals:   03/21/23 1035  TempSrc:   PainSc: 0-No pain                 Lorah Kalina C Brazos Sandoval

## 2023-03-21 NOTE — Transfer of Care (Signed)
Immediate Anesthesia Transfer of Care Note  Patient: Debbie Bell  Procedure(s) Performed: ESOPHAGOGASTRODUODENOSCOPY (EGD) WITH PROPOFOL (Mouth)  Patient Location: PACU  Anesthesia Type:General  Level of Consciousness: awake  Airway & Oxygen Therapy: Patient Spontanous Breathing  Post-op Assessment: Report given to RN and Post -op Vital signs reviewed and stable  Post vital signs: Reviewed and stable  Last Vitals:  Vitals Value Taken Time  BP 138/82 03/21/23 1025  Temp 36.7 C 03/21/23 1025  Pulse 101 03/21/23 1026  Resp 20 03/21/23 1026  SpO2 95 % 03/21/23 1026  Vitals shown include unfiled device data.  Last Pain:  Vitals:   03/21/23 0908  TempSrc: Temporal  PainSc: 0-No pain         Complications: No notable events documented.

## 2023-03-22 ENCOUNTER — Encounter: Payer: Self-pay | Admitting: Gastroenterology

## 2023-04-13 ENCOUNTER — Other Ambulatory Visit: Payer: Self-pay

## 2023-04-13 DIAGNOSIS — K649 Unspecified hemorrhoids: Secondary | ICD-10-CM

## 2023-04-13 DIAGNOSIS — J301 Allergic rhinitis due to pollen: Secondary | ICD-10-CM

## 2023-04-13 DIAGNOSIS — L259 Unspecified contact dermatitis, unspecified cause: Secondary | ICD-10-CM

## 2023-04-13 DIAGNOSIS — B354 Tinea corporis: Secondary | ICD-10-CM

## 2023-04-13 MED ORDER — NYSTATIN-TRIAMCINOLONE 100000-0.1 UNIT/GM-% EX CREA: TOPICAL_CREAM | Freq: Two times a day (BID) | CUTANEOUS | 0 refills | Status: AC

## 2023-04-13 MED ORDER — CETIRIZINE-PSEUDOEPHEDRINE ER 5-120 MG PO TB12: 1.0000 | ORAL_TABLET | Freq: Two times a day (BID) | ORAL | 0 refills | Status: AC

## 2023-04-13 MED ORDER — HYDROCORTISONE (PERIANAL) 2.5 % EX CREA: 1.0000 | TOPICAL_CREAM | Freq: Two times a day (BID) | CUTANEOUS | 0 refills | Status: AC

## 2023-06-28 ENCOUNTER — Other Ambulatory Visit: Payer: Self-pay

## 2023-06-28 DIAGNOSIS — K219 Gastro-esophageal reflux disease without esophagitis: Secondary | ICD-10-CM

## 2023-06-28 MED ORDER — PANTOPRAZOLE SODIUM 40 MG PO TBEC: DELAYED_RELEASE_TABLET | ORAL | 0 refills | Status: AC

## 2023-08-15 ENCOUNTER — Ambulatory Visit
Admission: RE | Admit: 2023-08-15 | Discharge: 2023-08-15 | Disposition: A | Discharge status: Home / Self Care | Payer: BC Managed Care – PPO | Source: Ambulatory Visit | Attending: Otolaryngology | Admitting: Otolaryngology

## 2023-08-15 DIAGNOSIS — E079 Disorder of thyroid, unspecified: Secondary | ICD-10-CM | POA: Insufficient documentation

## 2023-08-15 DIAGNOSIS — E041 Nontoxic single thyroid nodule: Secondary | ICD-10-CM | POA: Diagnosis not present

## 2023-08-22 DIAGNOSIS — E041 Nontoxic single thyroid nodule: Secondary | ICD-10-CM | POA: Diagnosis not present

## 2023-08-22 DIAGNOSIS — J301 Allergic rhinitis due to pollen: Secondary | ICD-10-CM | POA: Diagnosis not present

## 2023-08-31 ENCOUNTER — Other Ambulatory Visit: Payer: Self-pay

## 2023-08-31 DIAGNOSIS — E782 Mixed hyperlipidemia: Secondary | ICD-10-CM

## 2023-08-31 MED ORDER — ROSUVASTATIN CALCIUM 5 MG PO TABS: 5.0000 mg | ORAL_TABLET | Freq: Every day | ORAL | 0 refills | Status: AC

## 2023-09-28 ENCOUNTER — Other Ambulatory Visit: Payer: Self-pay

## 2023-09-28 DIAGNOSIS — E782 Mixed hyperlipidemia: Secondary | ICD-10-CM

## 2023-09-28 NOTE — Telephone Encounter (Signed)
MyChart message sent to patient to schedule an appointment.
